# Patient Record
Sex: Male | Born: 1945
Health system: Southern US, Community
[De-identification: ages and names within clinical notes are randomized; demographics above are authoritative.]

## PROBLEM LIST (undated history)

## (undated) DIAGNOSIS — I491 Atrial premature depolarization: Secondary | ICD-10-CM

## (undated) DIAGNOSIS — G709 Myoneural disorder, unspecified: Secondary | ICD-10-CM

## (undated) DIAGNOSIS — E119 Type 2 diabetes mellitus without complications: Secondary | ICD-10-CM

## (undated) DIAGNOSIS — E785 Hyperlipidemia, unspecified: Secondary | ICD-10-CM

## (undated) DIAGNOSIS — M1990 Unspecified osteoarthritis, unspecified site: Secondary | ICD-10-CM

## (undated) DIAGNOSIS — I359 Nonrheumatic aortic valve disorder, unspecified: Secondary | ICD-10-CM

## (undated) DIAGNOSIS — N529 Male erectile dysfunction, unspecified: Secondary | ICD-10-CM

## (undated) DIAGNOSIS — K219 Gastro-esophageal reflux disease without esophagitis: Secondary | ICD-10-CM

## (undated) DIAGNOSIS — Q631 Lobulated, fused and horseshoe kidney: Secondary | ICD-10-CM

## (undated) DIAGNOSIS — I350 Nonrheumatic aortic (valve) stenosis: Secondary | ICD-10-CM

## (undated) DIAGNOSIS — L309 Dermatitis, unspecified: Secondary | ICD-10-CM

## (undated) DIAGNOSIS — I499 Cardiac arrhythmia, unspecified: Secondary | ICD-10-CM

## (undated) HISTORY — DX: Lobulated, fused and horseshoe kidney: Q63.1

## (undated) HISTORY — DX: Gastro-esophageal reflux disease without esophagitis: K21.9

## (undated) HISTORY — DX: Dermatitis, unspecified: L30.9

## (undated) HISTORY — DX: Unspecified osteoarthritis, unspecified site: M19.90

## (undated) HISTORY — DX: Nonrheumatic aortic valve disorder, unspecified: I35.9

## (undated) HISTORY — DX: Type 2 diabetes mellitus without complications: E11.9

## (undated) HISTORY — DX: Hyperlipidemia, unspecified: E78.5

## (undated) HISTORY — DX: Male erectile dysfunction, unspecified: N52.9

---

## 1998-01-08 ENCOUNTER — Ambulatory Visit (HOSPITAL_COMMUNITY): Admission: RE | Admit: 1998-01-08 | Discharge: 1998-01-08 | Payer: Self-pay | Admitting: Gastroenterology

## 2000-02-27 ENCOUNTER — Encounter: Admission: RE | Admit: 2000-02-27 | Discharge: 2000-02-27 | Payer: Self-pay | Admitting: Gastroenterology

## 2000-02-27 ENCOUNTER — Encounter: Payer: Self-pay | Admitting: Gastroenterology

## 2001-06-28 ENCOUNTER — Ambulatory Visit (HOSPITAL_COMMUNITY): Admission: RE | Admit: 2001-06-28 | Discharge: 2001-06-28 | Payer: Self-pay | Admitting: *Deleted

## 2001-06-28 ENCOUNTER — Encounter (INDEPENDENT_AMBULATORY_CARE_PROVIDER_SITE_OTHER): Payer: Self-pay | Admitting: Specialist

## 2002-03-13 ENCOUNTER — Encounter: Payer: Self-pay | Admitting: Family Medicine

## 2002-03-13 ENCOUNTER — Encounter: Admission: RE | Admit: 2002-03-13 | Discharge: 2002-03-13 | Payer: Self-pay | Admitting: Family Medicine

## 2007-06-21 ENCOUNTER — Encounter: Admission: RE | Admit: 2007-06-21 | Discharge: 2007-06-21 | Payer: Self-pay | Admitting: Family Medicine

## 2010-04-08 ENCOUNTER — Other Ambulatory Visit: Payer: Self-pay | Admitting: Family Medicine

## 2010-04-08 DIAGNOSIS — N5089 Other specified disorders of the male genital organs: Secondary | ICD-10-CM

## 2010-04-14 ENCOUNTER — Ambulatory Visit
Admission: RE | Admit: 2010-04-14 | Discharge: 2010-04-14 | Disposition: A | Discharge status: Home / Self Care | Payer: BC Managed Care – HMO | Source: Ambulatory Visit | Attending: Family Medicine | Admitting: Family Medicine

## 2010-04-14 DIAGNOSIS — N5089 Other specified disorders of the male genital organs: Secondary | ICD-10-CM

## 2011-02-23 DIAGNOSIS — N529 Male erectile dysfunction, unspecified: Secondary | ICD-10-CM | POA: Insufficient documentation

## 2013-02-10 ENCOUNTER — Other Ambulatory Visit: Payer: Self-pay | Admitting: Sports Medicine

## 2013-02-10 DIAGNOSIS — M545 Low back pain, unspecified: Secondary | ICD-10-CM

## 2013-02-19 ENCOUNTER — Other Ambulatory Visit: Payer: BC Managed Care – HMO

## 2013-03-01 ENCOUNTER — Other Ambulatory Visit: Payer: BC Managed Care – HMO

## 2013-03-31 ENCOUNTER — Ambulatory Visit
Admission: RE | Admit: 2013-03-31 | Discharge: 2013-03-31 | Disposition: A | Discharge status: Home / Self Care | Payer: 59 | Source: Ambulatory Visit | Attending: Sports Medicine | Admitting: Sports Medicine

## 2013-03-31 DIAGNOSIS — M545 Low back pain, unspecified: Secondary | ICD-10-CM

## 2013-10-23 ENCOUNTER — Institutional Professional Consult (permissible substitution): Payer: 59 | Admitting: Interventional Cardiology

## 2013-11-13 ENCOUNTER — Encounter: Payer: Self-pay | Admitting: Interventional Cardiology

## 2013-11-13 ENCOUNTER — Ambulatory Visit (INDEPENDENT_AMBULATORY_CARE_PROVIDER_SITE_OTHER): Payer: Medicare Other | Admitting: Interventional Cardiology

## 2013-11-13 VITALS — BP 148/84 | HR 58 | Ht 63.0 in | Wt 219.4 lb

## 2013-11-13 DIAGNOSIS — I491 Atrial premature depolarization: Secondary | ICD-10-CM | POA: Insufficient documentation

## 2013-11-13 DIAGNOSIS — E785 Hyperlipidemia, unspecified: Secondary | ICD-10-CM | POA: Insufficient documentation

## 2013-11-13 DIAGNOSIS — R011 Cardiac murmur, unspecified: Secondary | ICD-10-CM

## 2013-11-13 DIAGNOSIS — E663 Overweight: Secondary | ICD-10-CM

## 2013-11-13 DIAGNOSIS — R001 Bradycardia, unspecified: Secondary | ICD-10-CM

## 2013-11-13 NOTE — Patient Instructions (Signed)
Your physician recommends that you schedule a follow-up appointment as needed   Your physician has requested that you have an echocardiogram. Echocardiography is a painless test that uses sound waves to create images of your heart. It provides your doctor with information about the size and shape of your heart and how well your heart's chambers and valves are working. This procedure takes approximately one hour. There are no restrictions for this procedure.

## 2013-11-13 NOTE — Progress Notes (Signed)
Patient ID: QUANTEL MCINTURFF, male   DOB: 1945/09/25, 68 y.o.   MRN: [id number]     Patient ID: Nicholai Willette Atayde MRN: [id number] DOB/AGE: [birth year] 67 y.o.   Referring Physician Dr. Dorthy Cooler   Reason for Consultation PACs  HPI: 68 y/o who has had an irregular heart beat for many years.  He denies chest pain or lightheadedness.  No syncope.  He had a stress tests about 20 years ago which was apparently normal.  He had a spine problem which required injections.  He limits caffeine due to GERD.  He occasionally drinks tea.  He does not feel the skipped beat, but it has been mentioned by several doctors over the course of many years.  He is here for further evaluation.    His job is physical.  He has to lift heavy things.  No cardiac issues. No chest pain or discomfort.  He has some fatigue.  He has never been told that he stops breathing with sleeping   Current Outpatient Prescriptions  Medication Sig Dispense Refill  . acetaminophen (TYLENOL) 650 MG CR tablet Take 650 mg by mouth every 8 (eight) hours as needed for pain.    Marland Kitchen aspirin EC 81 MG tablet Take 81 mg by mouth daily.    . cholecalciferol (VITAMIN D) 1000 UNITS tablet Take 1,000 Units by mouth daily.    Marland Kitchen CIALIS 20 MG tablet   5  . CRESTOR 10 MG tablet   11  . meloxicam (MOBIC) 7.5 MG tablet   3  . Multiple Vitamin (MULTI VITAMIN DAILY) TABS Take by mouth.     No current facility-administered medications for this visit.   Past Medical History  Diagnosis Date  . Hyperlipidemia   . GERD (gastroesophageal reflux disease)   . Eczema   . ED (erectile dysfunction)   . Aortic valve calcification     mild  . Arthritis     knee  . Horseshoe kidney     Family History  Problem Relation Age of Onset  . Aneurysm Mother   . Stroke Father     History   Social History  . Marital Status: Single    Spouse Name: N/A    Number of Children: N/A  . Years of Education: N/A   Occupational History  . Not on file.   Social History  Main Topics  . Smoking status: Never Smoker   . Smokeless tobacco: Not on file  . Alcohol Use: Not on file  . Drug Use: Not on file  . Sexual Activity: Not on file   Other Topics Concern  . Not on file   Social History Narrative    No past surgical history on file.    (Not in a hospital admission)  Review of systems complete and found to be negative unless listed above .  No nausea, vomiting.  No fever chills, No focal weakness,  No palpitations.  Physical Exam: Filed Vitals:   11/13/13 1510  BP: 148/84  Pulse: 58    Weight: 219 lb 6.4 oz (99.519 kg)  Physical exam: No apparent distress, obese  [employer]/AT EOMI No JVD, No carotid bruit RRR S5K5 , 2/6 systolic murmur No wheezing Soft. NT, nondistended No edema. No focal motor or sensory deficits Normal affect  Labs:  No results found for: WBC, HGB, HCT, MCV, PLT No results for input(s): NA, K, CL, CO2, BUN, CREATININE, CALCIUM, PROT, BILITOT, ALKPHOS, ALT, AST, GLUCOSE in the last 168 hours.  Invalid input(s): LABALBU  No results found for: CKTOTAL, CKMB, CKMBINDEX, TROPONINI No results found for: CHOL No results found for: HDL No results found for: LDLCALC No results found for: TRIG No results found for: CHOLHDL No results found for: LDLDIRECT     EKG:NSR, PACs  ASSESSMENT AND PLAN:  1) PACs: Already minimizes caffeine.  This is likely benign.  We'll check echocardiogram for any structural heart disease.  2) Murmur: h/o artic valve clacification.  Plan echocardiogram to eval for aortic stenosis.    3) Elevated BP: Better on repeat check.  Discussed that he is at risk for developing hypertension. He would benefit from weight loss.  4) Obesity: Stressed importance of weight loss.  Set target of 200 lbs to get to in the next months.  5) Hyperlipidemia: Continue crestor.     Signed:   Mina Marble, MD, [facility] 11/13/2013, 3:27 PM

## 2013-11-14 ENCOUNTER — Ambulatory Visit (HOSPITAL_COMMUNITY): Payer: Medicare Other | Attending: Cardiology

## 2013-11-14 ENCOUNTER — Other Ambulatory Visit: Payer: Self-pay

## 2013-11-14 DIAGNOSIS — R011 Cardiac murmur, unspecified: Secondary | ICD-10-CM

## 2013-11-21 ENCOUNTER — Telehealth: Payer: Self-pay | Admitting: Interventional Cardiology

## 2013-11-21 NOTE — Telephone Encounter (Signed)
Pt rtn call re lab results, pls call [phone], if na, leave results on voicemail

## 2013-11-21 NOTE — Telephone Encounter (Signed)
I left a message of the patient's results at the contact # below.

## 2013-11-23 ENCOUNTER — Encounter: Payer: Self-pay | Admitting: Interventional Cardiology

## 2013-11-24 ENCOUNTER — Telehealth: Payer: Self-pay | Admitting: Interventional Cardiology

## 2013-11-24 NOTE — Telephone Encounter (Signed)
New problem ° ° °Pt returning call concerning results. Please call pt. °

## 2013-11-24 NOTE — Telephone Encounter (Signed)
lmtcb

## 2013-11-28 NOTE — Telephone Encounter (Signed)
[email or web address]

## 2013-11-29 NOTE — Telephone Encounter (Signed)
Letter sent to patient to call for test results.

## 2013-11-29 NOTE — Telephone Encounter (Signed)
Left message for patient to call back  

## 2013-12-04 ENCOUNTER — Telehealth: Payer: Self-pay | Admitting: Interventional Cardiology

## 2013-12-04 NOTE — Telephone Encounter (Signed)
New message    Calling for test results  

## 2013-12-06 NOTE — Telephone Encounter (Signed)
I spoke with the patient and made him aware of his echo results.

## 2013-12-06 NOTE — Telephone Encounter (Signed)
Left message to call back  

## 2014-01-24 ENCOUNTER — Other Ambulatory Visit: Payer: Self-pay | Admitting: Gastroenterology

## 2014-02-09 ENCOUNTER — Other Ambulatory Visit: Payer: Self-pay | Admitting: Neurological Surgery

## 2014-02-09 DIAGNOSIS — M48061 Spinal stenosis, lumbar region without neurogenic claudication: Secondary | ICD-10-CM

## 2014-02-22 ENCOUNTER — Other Ambulatory Visit

## 2014-03-02 ENCOUNTER — Ambulatory Visit
Admission: RE | Admit: 2014-03-02 | Discharge: 2014-03-02 | Disposition: A | Discharge status: Home / Self Care | Payer: 59 | Source: Ambulatory Visit | Attending: Neurological Surgery | Admitting: Neurological Surgery

## 2014-03-02 DIAGNOSIS — M48061 Spinal stenosis, lumbar region without neurogenic claudication: Secondary | ICD-10-CM

## 2014-05-22 ENCOUNTER — Other Ambulatory Visit: Payer: Self-pay | Admitting: Gastroenterology

## 2014-05-23 ENCOUNTER — Encounter (HOSPITAL_COMMUNITY): Payer: Self-pay | Admitting: *Deleted

## 2014-05-26 NOTE — Anesthesia Preprocedure Evaluation (Addendum)
Anesthesia Evaluation  Patient identified by MRN, date of birth, ID band Patient awake    Reviewed: Allergy & Precautions, NPO status , Patient's Chart, lab work & pertinent test results, reviewed documented beta blocker date and time   Airway Mallampati: II       Dental  (+) Dental Advisory Given, Teeth Intact   Pulmonary  breath sounds clear to auscultation        Cardiovascular + Valvular Problems/Murmurs AS Rhythm:Regular  ECHO 2015 EF 60%, AS area 1.3, gradient 18   Neuro/Psych    GI/Hepatic GERD-  ,  Endo/Other    Renal/GU Horse shoe kidney     Musculoskeletal   Abdominal (+)  Abdomen: soft.    Peds  Hematology   Anesthesia Other Findings   Reproductive/Obstetrics                            Anesthesia Physical Anesthesia Plan  ASA: II  Anesthesia Plan: MAC   Post-op Pain Management:    Induction: Intravenous  Airway Management Planned: Nasal Cannula  Additional Equipment:   Intra-op Plan:   Post-operative Plan:   Informed Consent: I have reviewed the patients History and Physical, chart, labs and discussed the procedure including the risks, benefits and alternatives for the proposed anesthesia with the patient or authorized representative who has indicated his/her understanding and acceptance.     Plan Discussed with:   Anesthesia Plan Comments: (AS, moderate)        Anesthesia Quick Evaluation

## 2014-05-29 ENCOUNTER — Encounter (HOSPITAL_COMMUNITY)
Admission: RE | Disposition: A | Discharge status: Home / Self Care | Payer: Self-pay | Source: Ambulatory Visit | Attending: Gastroenterology

## 2014-05-29 ENCOUNTER — Ambulatory Visit (HOSPITAL_COMMUNITY): Payer: 59 | Admitting: Anesthesiology

## 2014-05-29 ENCOUNTER — Ambulatory Visit (HOSPITAL_COMMUNITY)
Admission: RE | Admit: 2014-05-29 | Discharge: 2014-05-29 | Disposition: A | Discharge status: Home / Self Care | Payer: 59 | Source: Ambulatory Visit | Attending: Gastroenterology | Admitting: Gastroenterology

## 2014-05-29 ENCOUNTER — Encounter (HOSPITAL_COMMUNITY): Payer: Self-pay | Admitting: Certified Registered"

## 2014-05-29 DIAGNOSIS — M1990 Unspecified osteoarthritis, unspecified site: Secondary | ICD-10-CM | POA: Insufficient documentation

## 2014-05-29 DIAGNOSIS — Z86010 Personal history of colonic polyps: Secondary | ICD-10-CM | POA: Diagnosis present

## 2014-05-29 DIAGNOSIS — N529 Male erectile dysfunction, unspecified: Secondary | ICD-10-CM | POA: Insufficient documentation

## 2014-05-29 DIAGNOSIS — Z79899 Other long term (current) drug therapy: Secondary | ICD-10-CM | POA: Insufficient documentation

## 2014-05-29 DIAGNOSIS — I350 Nonrheumatic aortic (valve) stenosis: Secondary | ICD-10-CM | POA: Diagnosis not present

## 2014-05-29 DIAGNOSIS — Z888 Allergy status to other drugs, medicaments and biological substances status: Secondary | ICD-10-CM | POA: Insufficient documentation

## 2014-05-29 DIAGNOSIS — E663 Overweight: Secondary | ICD-10-CM | POA: Insufficient documentation

## 2014-05-29 DIAGNOSIS — Z7982 Long term (current) use of aspirin: Secondary | ICD-10-CM | POA: Insufficient documentation

## 2014-05-29 DIAGNOSIS — K6389 Other specified diseases of intestine: Secondary | ICD-10-CM | POA: Diagnosis not present

## 2014-05-29 DIAGNOSIS — D120 Benign neoplasm of cecum: Secondary | ICD-10-CM | POA: Insufficient documentation

## 2014-05-29 DIAGNOSIS — E785 Hyperlipidemia, unspecified: Secondary | ICD-10-CM | POA: Diagnosis not present

## 2014-05-29 DIAGNOSIS — K219 Gastro-esophageal reflux disease without esophagitis: Secondary | ICD-10-CM | POA: Insufficient documentation

## 2014-05-29 HISTORY — DX: Myoneural disorder, unspecified: G70.9

## 2014-05-29 HISTORY — PX: COLONOSCOPY WITH PROPOFOL: SHX5780

## 2014-05-29 SURGERY — COLONOSCOPY WITH PROPOFOL
Anesthesia: Monitor Anesthesia Care

## 2014-05-29 MED ORDER — PROPOFOL 10 MG/ML IV BOLUS
INTRAVENOUS | Status: AC
  Filled 2014-05-29: qty 20

## 2014-05-29 MED ORDER — LACTATED RINGERS IV SOLN
INTRAVENOUS | Status: DC | PRN
  Administered 2014-05-29: 08:00:00 via INTRAVENOUS

## 2014-05-29 MED ORDER — PROPOFOL 10 MG/ML IV BOLUS
INTRAVENOUS | Status: DC | PRN
  Administered 2014-05-29: 200 mg via INTRAVENOUS
  Administered 2014-05-29: 50 mg via INTRAVENOUS
  Administered 2014-05-29: 100 mg via INTRAVENOUS

## 2014-05-29 MED ORDER — SODIUM CHLORIDE 0.9 % IV SOLN: INTRAVENOUS | Status: DC

## 2014-05-29 MED ORDER — LIDOCAINE HCL (PF) 2 % IJ SOLN
INTRAMUSCULAR | Status: DC | PRN
  Administered 2014-05-29: 20 mg via INTRADERMAL

## 2014-05-29 MED ORDER — LIDOCAINE HCL (CARDIAC) 20 MG/ML IV SOLN
INTRAVENOUS | Status: AC
  Filled 2014-05-29: qty 5

## 2014-05-29 MED ORDER — PROPOFOL INFUSION 10 MG/ML OPTIME: INTRAVENOUS | Status: DC | PRN

## 2014-05-29 SURGICAL SUPPLY — 21 items

## 2014-05-29 NOTE — Transfer of Care (Signed)
Immediate Anesthesia Transfer of Care Note  Patient: Allen Sanchez  Procedure(s) Performed: Procedure(s): COLONOSCOPY WITH PROPOFOL (N/A)  Patient Location: PACU  Anesthesia Type:MAC  Level of Consciousness:  sedated, patient cooperative and responds to stimulation  Airway & Oxygen Therapy:Patient Spontanous Breathing and Patient connected to face mask oxgen  Post-op Assessment:  Report given to PACU RN and Post -op Vital signs reviewed and stable  Post vital signs:  Reviewed and stable  Last Vitals:  Filed Vitals:   05/29/14 0730  BP: 148/91  Pulse: 58  Temp: 36.6 C  Resp: 17    Complications: No apparent anesthesia complications

## 2014-05-29 NOTE — Anesthesia Postprocedure Evaluation (Signed)
  Anesthesia Post-op Note  Patient: Allen Sanchez  Procedure(s) Performed: Procedure(s): COLONOSCOPY WITH PROPOFOL (N/A)  Patient Location: PACU  Anesthesia Type:MAC  Level of Consciousness: awake  Airway and Oxygen Therapy: Patient Spontanous Breathing  Post-op Pain: none  Post-op Assessment: Post-op Vital signs reviewed and Patient's Cardiovascular Status Stable  Post-op Vital Signs: Reviewed and stable  Last Vitals:  Filed Vitals:   05/29/14 0730  BP: 148/91  Pulse: 58  Temp: 36.6 C  Resp: 17    Complications: No apparent anesthesia complications

## 2014-05-29 NOTE — Discharge Instructions (Signed)

## 2014-05-29 NOTE — H&P (Signed)
  Procedure: Surveillance colonoscopy. Normal surveillance colonoscopy performed on 10/21/2007. History of adenomatous colon polyps removed colonoscopically in the past  History: The patient is a 69 year old male born 08-06-1945. He is scheduled to undergo a surveillance colonoscopy today.  Past medical history: Hypercholesterolemia. Gastroesophageal reflux. Eczema. Calcification of the aortic valve and mitral valve. Osteoarthritis of the knee. Spinal stenosis. Vasectomy.  Medication allergies: None. Medication side effects occurred with the following drugs: Statin drugs cause myalgias.  Exam: The patient is alert and lying comfortably on the endoscopy stretcher. Abdomen is soft and nontender to palpation. Lungs are clear to auscultation. Cardiac exam reveals a regular rhythm.  Plan: Proceed with surveillance colonoscopy

## 2014-05-29 NOTE — Op Note (Signed)
Procedure: Surveillance colonoscopy. Adenomatous colon polyps removed colonoscopically in the past  Endoscopist: Earle Gell  Premedication: Propofol administered by anesthesia  Procedure: The patient was placed in the left lateral decubitus position. Anal inspection and digital rectal exam were normal. The Pentax pediatric colonoscope was introduced into the rectum and advanced to the cecum. A normal-appearing appendiceal orifice was identified. A prominent ileocecal valve was intubated and the terminal ileum inspected. Colonic preparation for the exam today was good. Withdrawal time was 14 minutes  Rectum. Normal. Retroflexed view of the distal rectum was normal  Sigmoid colon and descending colon. Normal  Splenic flexure. Normal  Transverse colon. Normal  Hepatic flexure. Normal  Ascending colon. Normal  Cecum and ileocecal valve. From the proximal cecum adjacent to the appendiceal orifice, a 7 mm sessile polyp was removed with the hot snare. The polypectomy site was closed with an Endo Clip to prevent bleeding. The surface of the prominent ileocecal valve was biopsied to rule out neoplastic tissue. The prominent ileocecal valve has been biopsied on previous colonoscopic exams and diagnosed as a lipomatous ileocecal valve.  Assessment: A 7 mm proximal cecal polyp was removed with the hot snare and the polypectomy site closed with an Endo Clip. The prominent appearing ileocecal valve was biopsied to rule out neoplastic tissue.  Recommendation: I will review the pathology report to determine when the patient should undergo a repeat colonoscopy

## 2014-05-30 ENCOUNTER — Encounter (HOSPITAL_COMMUNITY): Payer: Self-pay | Admitting: Gastroenterology

## 2016-09-16 ENCOUNTER — Other Ambulatory Visit: Payer: Self-pay | Admitting: Physical Medicine and Rehabilitation

## 2016-09-16 DIAGNOSIS — M545 Low back pain: Secondary | ICD-10-CM

## 2016-09-16 DIAGNOSIS — M7918 Myalgia, other site: Secondary | ICD-10-CM

## 2016-09-28 ENCOUNTER — Ambulatory Visit
Admission: RE | Admit: 2016-09-28 | Discharge: 2016-09-28 | Disposition: A | Discharge status: Home / Self Care | Payer: BLUE CROSS/BLUE SHIELD | Source: Ambulatory Visit | Attending: Physical Medicine and Rehabilitation | Admitting: Physical Medicine and Rehabilitation

## 2016-09-28 DIAGNOSIS — M545 Low back pain: Secondary | ICD-10-CM

## 2016-09-28 DIAGNOSIS — M7918 Myalgia, other site: Secondary | ICD-10-CM

## 2017-01-14 ENCOUNTER — Ambulatory Visit: Payer: BLUE CROSS/BLUE SHIELD

## 2017-02-04 ENCOUNTER — Encounter: Payer: Self-pay | Admitting: Dietician

## 2017-02-04 ENCOUNTER — Encounter: Payer: BLUE CROSS/BLUE SHIELD | Attending: Family Medicine | Admitting: Dietician

## 2017-02-04 DIAGNOSIS — Z713 Dietary counseling and surveillance: Secondary | ICD-10-CM | POA: Diagnosis present

## 2017-02-04 DIAGNOSIS — E119 Type 2 diabetes mellitus without complications: Secondary | ICD-10-CM | POA: Diagnosis not present

## 2017-02-04 NOTE — Progress Notes (Signed)
Patient was seen on 02/04/17 for the first of a series of three diabetes self-management courses at [facility].  Patient Education Plan per assessed needs and concerns is to attend three course education program for Diabetes Self Management Education.  The following learning objectives were met by the patient during this class:  Describe diabetes  State some common risk factors for diabetes  Defines the role of glucose and insulin  Identifies type of diabetes and pathophysiology  Describe the relationship between diabetes and cardiovascular risk  State the members of the Healthcare Team  States the rationale for glucose monitoring  State when to test glucose  State their individual Target Range  State the importance of logging glucose readings  Describe how to interpret glucose readings  Identifies A1C target  Explain the correlation between A1c and eAG values  State symptoms and treatment of high blood glucose  State symptoms and treatment of low blood glucose  Explain proper technique for glucose testing  Identifies proper sharps disposal  Handouts given during class include:  Living Well with Diabetes book  Carb Counting and Meal Planning book  Meal Plan Card  Meal planning worksheet  Low Sodium Flavoring Tips  Types of Fats  The diabetes portion plate  K8M to eAG Conversion Chart  Diabetes Recommended Care Schedule  Support Group  Diabetes Success Plan  Core Class Satisfaction Survey   Follow-Up Plan:  Attend core 2

## 2017-02-06 ENCOUNTER — Emergency Department (HOSPITAL_COMMUNITY): Payer: BLUE CROSS/BLUE SHIELD

## 2017-02-06 ENCOUNTER — Emergency Department (HOSPITAL_COMMUNITY)
Admission: EM | Admit: 2017-02-06 | Discharge: 2017-02-06 | Disposition: A | Discharge status: Home / Self Care | Payer: BLUE CROSS/BLUE SHIELD | Attending: Emergency Medicine | Admitting: Emergency Medicine

## 2017-02-06 DIAGNOSIS — R42 Dizziness and giddiness: Secondary | ICD-10-CM | POA: Diagnosis present

## 2017-02-06 DIAGNOSIS — E119 Type 2 diabetes mellitus without complications: Secondary | ICD-10-CM | POA: Diagnosis not present

## 2017-02-06 DIAGNOSIS — I499 Cardiac arrhythmia, unspecified: Secondary | ICD-10-CM | POA: Diagnosis not present

## 2017-02-06 DIAGNOSIS — R001 Bradycardia, unspecified: Secondary | ICD-10-CM | POA: Diagnosis not present

## 2017-02-06 LAB — COMPREHENSIVE METABOLIC PANEL
ALT: 21 U/L (ref 17–63)
AST: 24 U/L (ref 15–41)
Albumin: 3.8 g/dL (ref 3.5–5.0)
Alkaline Phosphatase: 59 U/L (ref 38–126)
Anion gap: 9 (ref 5–15)
BILIRUBIN TOTAL: 0.7 mg/dL (ref 0.3–1.2)
BUN: 14 mg/dL (ref 6–20)
CALCIUM: 9.5 mg/dL (ref 8.9–10.3)
CO2: 24 mmol/L (ref 22–32)
Chloride: 107 mmol/L (ref 101–111)
Creatinine, Ser: 0.95 mg/dL (ref 0.61–1.24)
GFR calc Af Amer: >60 mL/min (ref >60)
GFR calc non Af Amer: >60 mL/min (ref >60)
Glucose, Bld: 116 mg/dL — ABNORMAL HIGH (ref 65–99)
POTASSIUM: 4.2 mmol/L (ref 3.5–5.1)
Sodium: 140 mmol/L (ref 135–145)
Total Protein: 6.9 g/dL (ref 6.5–8.1)

## 2017-02-06 LAB — CBC WITH DIFFERENTIAL/PLATELET
Basophils Absolute: 0 10*3/uL (ref 0.0–0.1)
Basophils Relative: 1 %
Eosinophils Absolute: 0.1 10*3/uL (ref 0.0–0.7)
Eosinophils Relative: 2 %
HEMATOCRIT: 44.5 % (ref 39.0–52.0)
Hemoglobin: 14.7 g/dL (ref 13.0–17.0)
Lymphocytes Relative: 43 %
Lymphs Abs: 1.2 10*3/uL (ref 0.7–4.0)
MCH: 29.1 pg (ref 26.0–34.0)
MCHC: 33 g/dL (ref 30.0–36.0)
MCV: 88.1 fL (ref 78.0–100.0)
MONO ABS: 0.2 10*3/uL (ref 0.1–1.0)
MONOS PCT: 9 %
Neutro Abs: 1.3 10*3/uL — ABNORMAL LOW (ref 1.7–7.7)
Neutrophils Relative %: 45 %
Platelets: 120 10*3/uL — ABNORMAL LOW (ref 150–400)
RBC: 5.05 MIL/uL (ref 4.22–5.81)
RDW: 14.6 % (ref 11.5–15.5)
WBC: 2.7 10*3/uL — ABNORMAL LOW (ref 4.0–10.5)

## 2017-02-06 LAB — I-STAT TROPONIN, ED: TROPONIN I, POC: 0.01 ng/mL (ref 0.00–0.08)

## 2017-02-06 NOTE — ED Provider Notes (Signed)
[facility] EMERGENCY DEPARTMENT Provider Note   CSN: [id number] Arrival date & time: 02/06/17  3016     History   Chief Complaint Chief Complaint  Patient presents with  . Bradycardia    HPI   Blood pressure (!) 168/83, pulse (!) 58, temperature 98 F (36.7 C), temperature source Oral, resp. rate 16, SpO2 99 %.  Allen Sanchez is a 72 y.o. male complaining of intermittent lightheaded sensation when going from sitting to standing over the course of the last several weeks, it has not stopped him from working, he has not syncopized.  The episodes are not getting worse but he states that he came in today to double check that he is okay to celebrate Super Bowl Sunday tomorrow.  He states that he has been taking apple cider vinegar with honey and water 2 weeks ago about the same onset of symptoms timing.  He stopped taking this several days ago.  He denies vertigo, nausea, vomiting, fever, chills, chest pain, shortness of breath, abdominal pain, melena, hematochezia.  He is not anticoagulated, he takes a low-dose aspirin daily.  He has been recently diagnosed with type 2 diabetes but is not taking any medication for that, his primary care doctor at [facility] asked him to go to a nutrition program first.  He takes a statin regularly.  He denies polyuria and polydipsia.  Past Medical History:  Diagnosis Date  . Aortic valve calcification    mild  . Arthritis    knee  . Diabetes mellitus without complication ([place])   . Eczema   . ED (erectile dysfunction)   . GERD (gastroesophageal reflux disease)   . Horseshoe kidney   . Hyperlipidemia   . Neuromuscular disorder ([facility])    Sciatic nerve pain greater left -under physical therapy now    Patient Active Problem List   Diagnosis Date Noted  . Murmur, cardiac 11/13/2013  . Overweight 11/13/2013  . PAC (premature atrial contraction) 11/13/2013  . Hyperlipidemia 11/13/2013    Past Surgical History:  Procedure  Laterality Date  . COLONOSCOPY WITH PROPOFOL N/A 05/29/2014   Procedure: COLONOSCOPY WITH PROPOFOL;  Surgeon: Garlan Fair, MD;  Location: [facility] ENDOSCOPY;  Service: Endoscopy;  Laterality: N/A;       Home Medications    Prior to Admission medications   Medication Sig Start Date End Date Taking? Authorizing Provider  acetaminophen (TYLENOL) 650 MG CR tablet Take 650 mg by mouth every 8 (eight) hours as needed for pain.    [provider]  cholecalciferol (VITAMIN D) 1000 UNITS tablet Take 1,000 Units by mouth daily.    [provider]  CIALIS 20 MG tablet  08/25/13   [provider]  CRESTOR 10 MG tablet  11/05/13   [provider]  meloxicam (MOBIC) 7.5 MG tablet  10/29/13   [provider]  Multiple Vitamin (MULTI VITAMIN DAILY) TABS Take by mouth.    [provider]    Family History Family History  Problem Relation Age of Onset  . Aneurysm Mother   . Stroke Father     Social History Social History   Tobacco Use  . Smoking status: Never Smoker  Substance Use Topics  . Alcohol use: Yes    Alcohol/week: 0.0 oz    Comment: 2 drinks per week(Sundays)  . Drug use: Not on file     Allergies   Crestor [rosuvastatin]; Lipitor [atorvastatin]; Pravastatin; and Zetia [ezetimibe]   Review of Systems Review of  Systems  A complete review of systems was obtained and all systems are negative except as noted in the HPI and PMH.   Physical Exam Updated Vital Signs BP (!) 150/76   Pulse 89   Temp 98 F (36.7 C) (Oral)   Resp 16   SpO2 100%   Physical Exam  Constitutional: He is oriented to person, place, and time. He appears well-developed and well-nourished. No distress.  HENT:  Head: Normocephalic and atraumatic.  Mouth/Throat: Oropharynx is clear and moist.  Eyes: Conjunctivae and EOM are normal. Pupils are equal, round, and reactive to light.  Neck: Normal range of motion. No JVD present. No tracheal deviation  present.  Cardiovascular: Normal rate, regular rhythm and intact distal pulses.  Radial pulse equal bilaterally  Pulmonary/Chest: Effort normal and breath sounds normal. No stridor. No respiratory distress. He has no wheezes. He has no rales. He exhibits no tenderness.  Abdominal: Soft. He exhibits no distension and no mass. There is no tenderness. There is no rebound and no guarding.  Musculoskeletal: Normal range of motion. He exhibits no edema or tenderness.  No calf asymmetry, superficial collaterals, palpable cords, edema, Homans sign negative bilaterally.    Neurological: He is alert and oriented to person, place, and time.  Skin: Skin is warm. He is not diaphoretic.  Psychiatric: He has a normal mood and affect.  Nursing note and vitals reviewed.    ED Treatments / Results  Labs (all labs ordered are listed, but only abnormal results are displayed) Labs Reviewed  CBC WITH DIFFERENTIAL/PLATELET - Abnormal; Notable for the following components:      Result Value   WBC 2.7 (*)    Platelets 120 (*)    Neutro Abs 1.3 (*)    All other components within normal limits  COMPREHENSIVE METABOLIC PANEL - Abnormal; Notable for the following components:   Glucose, Bld 116 (*)    All other components within normal limits  I-STAT TROPONIN, ED    EKG  EKG Interpretation  Date/Time:  Saturday February 06 2017 09:53:39 EST Ventricular Rate:  68 PR Interval:    QRS Duration: 82 QT Interval:  410 QTC Calculation: 436 R Axis:   38 Text Interpretation:  Sinus rhythm Atrial premature complex Baseline wander in lead(s) II III aVF Confirmed by Lacretia Leigh ([id number]) on 02/06/2017 11:07:00 AM       Radiology Dg Chest 2 View  Result Date: 02/06/2017 CLINICAL DATA:  Dizziness for 1 week.  Heart murmur. EXAM: CHEST  2 VIEW COMPARISON:  None. FINDINGS: Enlarged cardiac silhouette. Mild calcific atherosclerotic disease of the aorta. There is no evidence of focal airspace consolidation, pleural  effusion or pneumothorax. Osseous structures are without acute abnormality. Soft tissues are grossly normal. IMPRESSION: Enlarged cardiac silhouette. No evidence of pulmonary edema or focal airspace consolidation. Electronically Signed   By: Fidela Salisbury M.D.   On: 02/06/2017 11:28    Procedures Procedures (including critical care time)  Medications Ordered in ED Medications - No data to display   Initial Impression / Assessment and Plan / ED Course  I have reviewed the triage vital signs and the nursing notes.  Pertinent labs & imaging results that were available during my care of the patient were reviewed by me and considered in my medical decision making (see chart for details).     Vitals:   02/06/17 1015 02/06/17 1100 02/06/17 1130 02/06/17 1230  BP: (!) 148/99 (!) 158/78 (!) 155/76 (!) 150/76  Pulse: 96 92 89  Resp: (!) 21 16    Temp:      TempSrc:      SpO2: 100% 100% 100% 100%    Rondell Pardon Hullum is 72 y.o. male presenting with course of the last few weeks.  EKG with sinus bradycardia and PACs.  This is consistent with his prior EKG.  No real chest pain.  Orthostatic vital signs reassuring.  Blood work with no significant abnormality.  This is a shared visit with the attending physician who personally evaluated the patient and agrees with the care plan.   Evaluation does not show pathology that would require ongoing emergent intervention or inpatient treatment. Pt is hemodynamically stable and mentating appropriately. Discussed findings and plan with patient/guardian, who agrees with care plan. All questions answered. Return precautions discussed and outpatient follow up given.      Final Clinical Impressions(s) / ED Diagnoses   Final diagnoses:  [place]    ED Discharge Orders    None       Waynetta Pean 02/06/17 1249

## 2017-02-06 NOTE — Discharge Instructions (Signed)
Please follow with your primary care doctor in the next 2 days for a check-up. They must obtain records for further management.  ° °Do not hesitate to return to the Emergency Department for any new, worsening or concerning symptoms.  ° °

## 2017-02-06 NOTE — ED Provider Notes (Signed)
Medical screening examination/treatment/procedure(s) were conducted as a shared visit with non-physician practitioner(s) and myself.  I personally evaluated the patient during the encounter.   EKG Interpretation  Date/Time:  Saturday February 06 2017 09:53:39 EST Ventricular Rate:  68 PR Interval:    QRS Duration: 82 QT Interval:  410 QTC Calculation: 436 R Axis:   38 Text Interpretation:  Sinus rhythm Atrial premature complex Baseline wander in lead(s) II III aVF Confirmed by Lacretia Leigh ([id number]) on 02/06/2017 11:07:21 AM      72 year old male presents with weakness that has been intermittent times 1 week.  Is worse with standing.  Denies any associated chest pain or chest pressure.  Has had some belching and burping.  Labs here are reassuring.  Stable for discharge with return precautions   Lacretia Leigh, MD 02/06/17 1222

## 2017-02-06 NOTE — ED Notes (Signed)
Pt did well ambulating pass bathroom and back to room not complaints of dizz and SOB

## 2017-02-06 NOTE — ED Triage Notes (Signed)
Pt arrives by [facility] from pcp where pt was seen for weakness that has been intermittent for the last week. Pt was noted to be in a sinus bradycardia with frequent PACs. Pt reports no current pain but feels a feeling of "ingestion".

## 2017-02-11 ENCOUNTER — Encounter: Payer: BLUE CROSS/BLUE SHIELD | Attending: Family Medicine | Admitting: Dietician

## 2017-02-11 DIAGNOSIS — E119 Type 2 diabetes mellitus without complications: Secondary | ICD-10-CM | POA: Insufficient documentation

## 2017-02-11 DIAGNOSIS — Z713 Dietary counseling and surveillance: Secondary | ICD-10-CM | POA: Diagnosis not present

## 2017-02-11 NOTE — Progress Notes (Signed)
Patient was seen on 02/11/17 for the second of a series of three diabetes self-management courses at the [facility]. The following learning objectives were met by the patient during this class:   Describe the role of different macronutrients on glucose  Explain how carbohydrates affect blood glucose  State what foods contain the most carbohydrates  Demonstrate carbohydrate counting  Demonstrate how to read Nutrition Facts food label  Describe effects of various fats on heart health  Describe the importance of good nutrition for health and healthy eating strategies  Describe techniques for managing your shopping, cooking and meal planning  List strategies to follow meal plan when dining out  Describe the effects of alcohol on glucose and how to use it safely  Goals:  Follow Diabetes Meal Plan as instructed  Aim to spread carbs evenly throughout the day  Aim for 3 meals per day and snacks as needed Include lean protein foods to meals/snacks  Monitor glucose levels as instructed by your doctor   Follow-Up Plan:  Attend Core 3  Work towards following your personal food plan.   

## 2017-02-18 ENCOUNTER — Encounter: Payer: BLUE CROSS/BLUE SHIELD | Admitting: Dietician

## 2017-02-18 DIAGNOSIS — E119 Type 2 diabetes mellitus without complications: Secondary | ICD-10-CM

## 2017-02-18 DIAGNOSIS — Z713 Dietary counseling and surveillance: Secondary | ICD-10-CM | POA: Diagnosis not present

## 2017-02-18 NOTE — Progress Notes (Signed)
Patient was seen on 02/18/17 for the third of a series of three diabetes self-management courses at the [facility].   Allen Sanchez the amount of activity recommended for healthy living . Describe activities suitable for individual needs . Identify ways to regularly incorporate activity into daily life . Identify barriers to activity and ways to over come these barriers  Identify diabetes medications being personally used and their primary action for lowering glucose and possible side effects . Describe role of stress on blood glucose and develop strategies to address psychosocial issues . Identify diabetes complications and ways to prevent them  Explain how to manage diabetes during illness . Evaluate success in meeting personal goal . Establish 2-3 goals that they will plan to diligently work on  Goals:   I will count my carb choices at most meals and snacks  I will be active 10 minutes or more 3 times a week  I will eat less unhealthy fats by reading Nutrition Labels  Your patient has identified these potential barriers to change:  None  Your patient has identified their diabetes self-care support plan as  Family Support    Plan:  Attend Support Group as desired

## 2017-02-26 DIAGNOSIS — M545 Low back pain: Secondary | ICD-10-CM | POA: Diagnosis not present

## 2017-02-26 DIAGNOSIS — M48061 Spinal stenosis, lumbar region without neurogenic claudication: Secondary | ICD-10-CM | POA: Diagnosis not present

## 2017-02-26 DIAGNOSIS — M47817 Spondylosis without myelopathy or radiculopathy, lumbosacral region: Secondary | ICD-10-CM | POA: Diagnosis not present

## 2017-03-02 ENCOUNTER — Telehealth: Payer: Self-pay

## 2017-03-02 NOTE — Telephone Encounter (Signed)
SENT REFERRAL TO [facility]

## 2017-03-03 ENCOUNTER — Telehealth (HOSPITAL_COMMUNITY): Payer: Self-pay | Admitting: Family Medicine

## 2017-03-04 ENCOUNTER — Other Ambulatory Visit: Payer: Self-pay | Admitting: Family Medicine

## 2017-03-04 DIAGNOSIS — I491 Atrial premature depolarization: Secondary | ICD-10-CM

## 2017-03-04 DIAGNOSIS — I700 Atherosclerosis of aorta: Secondary | ICD-10-CM

## 2017-03-04 DIAGNOSIS — R42 Dizziness and giddiness: Secondary | ICD-10-CM

## 2017-03-04 NOTE — Telephone Encounter (Signed)
User: Cherie Dark A Date/time: 03/03/17 11:23 AM  Comment: Called pt and lmsg for him to CB to sch echo.Vassie Moment  Context:  Outcome:   Phone number: [phone] Phone Type: Home Phone  Comm. type: Telephone Call type: Outgoing  Contact: Buys, Demarko L Relation to patient: Self

## 2017-03-11 ENCOUNTER — Other Ambulatory Visit: Payer: Self-pay

## 2017-03-11 ENCOUNTER — Ambulatory Visit (HOSPITAL_COMMUNITY): Payer: BLUE CROSS/BLUE SHIELD | Attending: Cardiology

## 2017-03-11 DIAGNOSIS — E119 Type 2 diabetes mellitus without complications: Secondary | ICD-10-CM | POA: Insufficient documentation

## 2017-03-11 DIAGNOSIS — R011 Cardiac murmur, unspecified: Secondary | ICD-10-CM | POA: Insufficient documentation

## 2017-03-11 DIAGNOSIS — I4891 Unspecified atrial fibrillation: Secondary | ICD-10-CM | POA: Insufficient documentation

## 2017-03-11 DIAGNOSIS — I491 Atrial premature depolarization: Secondary | ICD-10-CM | POA: Diagnosis not present

## 2017-03-11 DIAGNOSIS — I119 Hypertensive heart disease without heart failure: Secondary | ICD-10-CM | POA: Diagnosis not present

## 2017-03-11 DIAGNOSIS — I083 Combined rheumatic disorders of mitral, aortic and tricuspid valves: Secondary | ICD-10-CM | POA: Insufficient documentation

## 2017-03-11 DIAGNOSIS — E785 Hyperlipidemia, unspecified: Secondary | ICD-10-CM | POA: Diagnosis not present

## 2017-03-11 DIAGNOSIS — I700 Atherosclerosis of aorta: Secondary | ICD-10-CM | POA: Insufficient documentation

## 2017-03-11 DIAGNOSIS — R42 Dizziness and giddiness: Secondary | ICD-10-CM

## 2017-03-23 ENCOUNTER — Telehealth: Payer: Self-pay

## 2017-03-23 NOTE — Telephone Encounter (Signed)
Sent referral to scheduling, attached notes to march file.  

## 2017-04-16 ENCOUNTER — Ambulatory Visit (INDEPENDENT_AMBULATORY_CARE_PROVIDER_SITE_OTHER): Payer: BLUE CROSS/BLUE SHIELD | Admitting: Cardiovascular Disease

## 2017-04-16 ENCOUNTER — Encounter: Payer: Self-pay | Admitting: Cardiovascular Disease

## 2017-04-16 DIAGNOSIS — I350 Nonrheumatic aortic (valve) stenosis: Secondary | ICD-10-CM

## 2017-04-16 DIAGNOSIS — E7800 Pure hypercholesterolemia, unspecified: Secondary | ICD-10-CM

## 2017-04-16 NOTE — Patient Instructions (Signed)
Medication Instructions: Your physician recommends that you continue on your current medications as directed. Please refer to the Current Medication list given to you today.   Testing/Procedures: Your physician has requested that you have an echocardiogram in 1 year. Echocardiography is a painless test that uses sound waves to create images of your heart. It provides your doctor with information about the size and shape of your heart and how well your heart's chambers and valves are working. This procedure takes approximately one hour. There are no restrictions for this procedure.  Follow-Up: Your physician wants you to follow-up in: 1 year with Dr. Berry--after echo. You will receive a reminder letter in the mail two months in advance. If you don't receive a letter, please call our office to schedule the follow-up appointment.  If you need a refill on your cardiac medications before your next appointment, please call your pharmacy.  

## 2017-04-16 NOTE — Progress Notes (Signed)
04/16/2017 Allen Sanchez   [birth year]  [id number]  Primary Physician Lujean Amel, MD Primary Cardiologist: Lorretta Harp MD Garret Reddish, [place], [place]  HPI:  Allen Sanchez is a 72 y.o.  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 His only cardiac risk factor is treated hyperlipidemia. He does have GERD. He's never had a heart attack or stroke. He denies chest pain or shortness of breath. A recent 2-D echo performed 03/11/17 revealed normal systolic function with moderate aortic stenosis, aortic valve area 1.03 cm with a peak gradient of 51 mmHg.   Current Meds  Medication Sig  . acetaminophen (TYLENOL) 650 MG CR tablet Take 650 mg by mouth every 8 (eight) hours as needed for pain.  Marland Kitchen aspirin 81 MG EC tablet Take by mouth.  . celecoxib (CELEBREX) 200 MG capsule TK 1 C PO QD PRN FOR KNEE PAIN  . cholecalciferol (VITAMIN D) 1000 UNITS tablet Take 1,000 Units by mouth daily.  Marland Kitchen CIALIS 20 MG tablet   . CRESTOR 10 MG tablet   . meloxicam (MOBIC) 7.5 MG tablet   . Multiple Vitamin (MULTI VITAMIN DAILY) TABS Take by mouth.     Allergies  Allergen Reactions  . Crestor [Rosuvastatin] Other (See Comments)    muscle weakness   . Lipitor [Atorvastatin] Other (See Comments)    muscle weakness   . Pravastatin Other (See Comments)    myalgias  . Zetia [Ezetimibe] Other (See Comments)    muscle weakness     Social History   Socioeconomic History  . Marital status: Married    Spouse name: Not on file  . Number of children: Not on file  . Years of education: Not on file  . Highest education level: Not on file  Occupational History  . Not on file  Social Needs  . Financial resource strain: Not on file  . Food insecurity:    Worry: Not on file   Inability: Not on file  . Transportation needs:    Medical: Not on file    Non-medical: Not on file  Tobacco Use  . Smoking status: Never Smoker  . Smokeless tobacco: Never Used  Substance and Sexual Activity  . Alcohol use: Yes    Alcohol/week: 0.0 oz    Comment: 2 drinks per week(Sundays)  . Drug use: Not on file  . Sexual activity: Not on file  Lifestyle  . Physical activity:    Days per week: Not on file    Minutes per session: Not on file  . Stress: Not on file  Relationships  . Social connections:    Talks on phone: Not on file    Gets together: Not on file    Attends religious service: Not on file    Active member of club or organization: Not on file    Attends meetings of clubs or organizations: Not on file    Relationship status: Not on file  . Intimate partner violence:    Fear of current or ex partner: Not on file    Emotionally abused: Not on file    Physically abused: Not on file    Forced sexual activity: Not on file  Other Topics Concern  . Not on file  Social History Narrative  . Not on file  Review of Systems: General: negative for chills, fever, night sweats or weight changes.  Cardiovascular: negative for chest pain, dyspnea on exertion, edema, orthopnea, palpitations, paroxysmal nocturnal dyspnea or shortness of breath Dermatological: negative for rash Respiratory: negative for cough or wheezing Urologic: negative for hematuria Abdominal: negative for nausea, vomiting, diarrhea, bright red blood per rectum, melena, or hematemesis Neurologic: negative for visual changes, syncope, or dizziness All other systems reviewed and are otherwise negative except as noted above.    Height 5\' 3"  (1.6 m), weight 197 lb (89.4 kg).  General appearance: alert and no distress Neck: no adenopathy, no carotid bruit, no JVD, supple, symmetrical, trachea midline and thyroid not enlarged, symmetric, no tenderness/mass/nodules Lungs: clear to auscultation  bilaterally Heart: 2/6 outflow tract murmur consistent with aortic stenosis Extremities: extremities normal, atraumatic, no cyanosis or edema Pulses: 2+ and symmetric Skin: Skin color, texture, turgor normal. No rashes or lesions Neurologic: Alert and oriented X 3, normal strength and tone. Normal symmetric reflexes. Normal coordination and gait  EKG not performed today  ASSESSMENT AND PLAN:   Hyperlipidemia History of hyperlipidemia on statin therapy followed by her PCP  Moderate aortic stenosis History of moderate aortic stenosis by 2-D echo recently performed 03/11/17 revealing normal LV systolic function, and aortic valve area 1.16 cm with a peak gradient of 51 mmHg. This represents mild progression since the last echo performed 11/14/13 although the patient is completely asymptomatic.we will continue to follow this on an annual basis.      Lorretta Harp MD FACP,FACC,FAHA, [facility] 04/16/2017 4:14 PM

## 2017-04-16 NOTE — Assessment & Plan Note (Signed)
History of moderate aortic stenosis by 2-D echo recently performed 03/11/17 revealing normal LV systolic function, and aortic valve area 1.16 cm with a peak gradient of 51 mmHg. This represents mild progression since the last echo performed 11/14/13 although the patient is completely asymptomatic.we will continue to follow this on an annual basis.

## 2017-04-16 NOTE — Assessment & Plan Note (Signed)
History of hyperlipidemia on statin therapy followed by her PCP. 

## 2017-11-17 DIAGNOSIS — Z79899 Other long term (current) drug therapy: Secondary | ICD-10-CM | POA: Diagnosis not present

## 2017-11-17 DIAGNOSIS — I491 Atrial premature depolarization: Secondary | ICD-10-CM | POA: Diagnosis not present

## 2017-11-17 DIAGNOSIS — E7800 Pure hypercholesterolemia, unspecified: Secondary | ICD-10-CM | POA: Diagnosis not present

## 2017-11-17 DIAGNOSIS — E119 Type 2 diabetes mellitus without complications: Secondary | ICD-10-CM | POA: Diagnosis not present

## 2017-11-17 DIAGNOSIS — M1710 Unilateral primary osteoarthritis, unspecified knee: Secondary | ICD-10-CM | POA: Diagnosis not present

## 2017-11-26 DIAGNOSIS — M170 Bilateral primary osteoarthritis of knee: Secondary | ICD-10-CM | POA: Diagnosis not present

## 2018-01-09 DIAGNOSIS — M5412 Radiculopathy, cervical region: Secondary | ICD-10-CM | POA: Diagnosis not present

## 2018-02-08 DIAGNOSIS — L308 Other specified dermatitis: Secondary | ICD-10-CM | POA: Diagnosis not present

## 2018-03-10 DIAGNOSIS — H25812 Combined forms of age-related cataract, left eye: Secondary | ICD-10-CM | POA: Diagnosis not present

## 2018-03-10 DIAGNOSIS — H401113 Primary open-angle glaucoma, right eye, severe stage: Secondary | ICD-10-CM | POA: Diagnosis not present

## 2018-03-11 DIAGNOSIS — M170 Bilateral primary osteoarthritis of knee: Secondary | ICD-10-CM | POA: Diagnosis not present

## 2018-04-19 ENCOUNTER — Telehealth: Payer: Self-pay | Admitting: *Deleted

## 2018-04-19 NOTE — Telephone Encounter (Signed)
Patient call

## 2018-04-22 ENCOUNTER — Ambulatory Visit: Payer: BLUE CROSS/BLUE SHIELD | Admitting: Cardiovascular Disease

## 2018-07-04 DIAGNOSIS — M170 Bilateral primary osteoarthritis of knee: Secondary | ICD-10-CM | POA: Diagnosis not present

## 2018-07-12 NOTE — Addendum Note (Signed)
Addended by: Annita Brod on: 07/12/2018 11:59 AM   Modules accepted: Orders

## 2018-07-14 ENCOUNTER — Other Ambulatory Visit (HOSPITAL_COMMUNITY): Payer: BLUE CROSS/BLUE SHIELD

## 2018-07-19 ENCOUNTER — Ambulatory Visit (HOSPITAL_COMMUNITY): Payer: BC Managed Care – PPO | Attending: Cardiology

## 2018-07-19 ENCOUNTER — Other Ambulatory Visit: Payer: Self-pay

## 2018-07-19 DIAGNOSIS — I350 Nonrheumatic aortic (valve) stenosis: Secondary | ICD-10-CM | POA: Diagnosis not present

## 2018-07-20 ENCOUNTER — Other Ambulatory Visit: Payer: Self-pay

## 2018-07-20 DIAGNOSIS — I350 Nonrheumatic aortic (valve) stenosis: Secondary | ICD-10-CM

## 2018-07-21 ENCOUNTER — Encounter: Payer: Self-pay | Admitting: Cardiovascular Disease

## 2018-07-21 ENCOUNTER — Other Ambulatory Visit: Payer: Self-pay

## 2018-07-21 ENCOUNTER — Ambulatory Visit (INDEPENDENT_AMBULATORY_CARE_PROVIDER_SITE_OTHER): Payer: BC Managed Care – PPO | Admitting: Cardiovascular Disease

## 2018-07-21 ENCOUNTER — Telehealth: Payer: Self-pay | Admitting: Cardiovascular Disease

## 2018-07-21 DIAGNOSIS — I350 Nonrheumatic aortic (valve) stenosis: Secondary | ICD-10-CM | POA: Diagnosis not present

## 2018-07-21 DIAGNOSIS — E782 Mixed hyperlipidemia: Secondary | ICD-10-CM

## 2018-07-21 NOTE — Progress Notes (Signed)
07/21/2018 [place]   08-May-1945  [id number]  Primary Physician Lujean Amel, MD Primary Cardiologist: Lorretta Harp MD Gar[facility] Redd[facility], [place], [place]  HPI:  Allen Sanchez is a 73 y.o.  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  I last saw him in the office 04/16/2017.  His only cardiac risk factor is treated hyperlipidemia. He does have GERD. He's never had a heart attack or stroke. He denies chest pain or shortness of breath. A recent 2-D echo performed 03/11/17 revealed normal systolic function with moderate aortic stenosis, aortic valve area 1.03 cm with a peak gradient of 51 mmHg.  Since I saw him a year ago his remained completely asymptomatic.  Follow-up echo performed 07/19/2018 was unchanged.  He denies chest pain or shortness of breath.   Current Meds  Medication Sig  . acetaminophen (TYLENOL) 650 MG CR tablet Take 650 mg by mouth every 8 (eight) hours as needed for pain.  . celecoxib (CELEBREX) 200 MG capsule TK 1 C PO QD PRN FOR KNEE PAIN  . cholecalciferol (VITAMIN D) 1000 UNITS tablet Take 1,000 Units by mouth daily.  Marland Kitchen CIALIS 20 MG tablet   . CRESTOR 10 MG tablet   . meloxicam (MOBIC) 7.5 MG tablet   . Multiple Vitamin (MULTI VITAMIN DAILY) TABS Take by mouth.  . [DISCONTINUED] aspirin 81 MG EC tablet Take by mouth.     Allergies  Allergen Reactions  . Crestor [Rosuvastatin] Other (See Comments)    muscle weakness   . Lipitor [Atorvastatin] Other (See Comments)    muscle weakness   . Pravastatin Other (See Comments)    myalgias  . Zetia [Ezetimibe] Other (See Comments)    muscle weakness     Social History   Socioeconomic History  . Marital status: Married    Spouse name: Not on file  . Number of children: Not on file   . Years of education: Not on file  . Highest education level: Not on file  Occupational History  . Not on file  Social Needs  . Financial resource strain: Not on file  . Food insecurity    Worry: Not on file    Inability: Not on file  . Transportation needs    Medical: Not on file    Non-medical: Not on file  Tobacco Use  . Smoking status: Never Smoker  . Smokeless tobacco: Never Used  Substance and Sexual Activity  . Alcohol use: Yes    Alcohol/week: 0.0 standard drinks    Comment: 2 drinks per week(Sundays)  . Drug use: Not on file  . Sexual activity: Not on file  Lifestyle  . Physical activity    Days per week: Not on file    Minutes per session: Not on file  . Stress: Not on file  Relationships  . Social Herbalist on phone: Not on file    Gets together: Not on file    Attends religious service: Not on file    Active member of club or organization: Not on file    Attends meetings of clubs or organizations: Not on file    Relationship status: Not on file  . Intimate partner violence    Fear of current or ex partner: Not on file  Emotionally abused: Not on file    Physically abused: Not on file    Forced sexual activity: Not on file  Other Topics Concern  . Not on file  Social History Narrative  . Not on file     Review of Systems: General: negative for chills, fever, night sweats or weight changes.  Cardiovascular: negative for chest pain, dyspnea on exertion, edema, orthopnea, palpitations, paroxysmal nocturnal dyspnea or shortness of breath Dermatological: negative for rash Respiratory: negative for cough or wheezing Urologic: negative for hematuria Abdominal: negative for nausea, vomiting, diarrhea, bright red blood per rectum, melena, or hematemesis Neurologic: negative for visual changes, syncope, or dizziness All other systems reviewed and are otherwise negative except as noted above.    Pulse 81, temperature (!) 97.2 F (36.2 C),  height 5\' 3"  (1.6 m), weight 179 lb 6.4 oz (81.4 kg), SpO2 99 %.  General appearance: alert and no distress Neck: no adenopathy, no carotid bruit, no JVD, supple, symmetrical, trachea midline and thyroid not enlarged, symmetric, no tenderness/mass/nodules Lungs: clear to auscultation bilaterally Heart: 2/6 systolic murmur at the base consistent with aortic stenosis. Extremities: extremities normal, atraumatic, no cyanosis or edema Pulses: 2+ and symmetric Skin: Skin color, texture, turgor normal. No rashes or lesions Neurologic: Alert and oriented X 3, normal strength and tone. Normal symmetric reflexes. Normal coordination and gait  EKG sinus rhythm at 63 with marked sinus arrhythmia.  I personally reviewed this EKG.  ASSESSMENT AND PLAN:   Hyperlipidemia History of hyperlipidemia on Crestor with lipid profile performed 05/20/2017 revealing total cholesterol 187, LDL of 105 and HDL 69.  Moderate aortic stenosis History of moderate aortic stenosis with recent echo performed 07/19/2018 revealing normal LV function with moderate AS unchanged from prior echo.  He is completely asymptomatic.  Will repeat again in 12 months.      Lorretta Harp MD FACP,FACC,FAHA, [facility] 07/21/2018 4:59 PM

## 2018-07-21 NOTE — Telephone Encounter (Signed)
LVM, reminding pt of his appt with Dr Gwenlyn Found on 07-21-18.

## 2018-07-21 NOTE — Assessment & Plan Note (Signed)
History of hyperlipidemia on Crestor with lipid profile performed 05/20/2017 revealing total cholesterol 187, LDL of 105 and HDL 69.

## 2018-07-21 NOTE — Patient Instructions (Signed)
Medication Instructions:  Your physician recommends that you continue on your current medications as directed. Please refer to the Current Medication list given to you today.  If you need a refill on your cardiac medications before your next appointment, please call your pharmacy.   Lab work: Your physician recommends that you return for lab work within 1-2 weeks: lipid and liver panels If you have labs (blood work) drawn today and your tests are completely normal, you will receive your results only by: Marland Kitchen MyChart Message (if you have MyChart) OR . A paper copy in the mail If you have any lab test that is abnormal or we need to change your treatment, we will call you to review the results.  Testing/Procedures: Your physician has requested that you have an echocardiogram. Echocardiography is a painless test that uses sound waves to create images of your heart. It provides your doctor with information about the size and shape of your heart and how well your heart's chambers and valves are working. This procedure takes approximately one hour. There are no restrictions for this procedure. TO BE SCHEDULED FOR APPROXIMATELY 12 MONTHS FROM TODAY. YOU WILL BE CONTACTED BY A SCHEDULER TO SET UP THIS APPOINTMENT.   Follow-Up: At [facility], you and your health needs are our priority.  As part of our continuing mission to provide you with exceptional heart care, we have created designated Provider Care Teams.  These Care Teams include your primary Cardiologist (physician) and Advanced Practice Providers (APPs -  Physician Assistants and Nurse Practitioners) who all work together to provide you with the care you need, when you need it. You will need a follow up appointment in 12 months WITH DR. Gwenlyn Found.  Please call our office 2 months in advance to schedule this appointment.

## 2018-07-21 NOTE — Assessment & Plan Note (Signed)
History of moderate aortic stenosis with recent echo performed 07/19/2018 revealing normal LV function with moderate AS unchanged from prior echo.  He is completely asymptomatic.  Will repeat again in 12 months.

## 2018-08-16 DIAGNOSIS — Z125 Encounter for screening for malignant neoplasm of prostate: Secondary | ICD-10-CM | POA: Diagnosis not present

## 2018-08-16 DIAGNOSIS — E119 Type 2 diabetes mellitus without complications: Secondary | ICD-10-CM | POA: Diagnosis not present

## 2018-08-16 DIAGNOSIS — E7800 Pure hypercholesterolemia, unspecified: Secondary | ICD-10-CM | POA: Diagnosis not present

## 2018-08-16 DIAGNOSIS — Z6833 Body mass index (BMI) 33.0-33.9, adult: Secondary | ICD-10-CM | POA: Diagnosis not present

## 2018-08-16 DIAGNOSIS — Z0000 Encounter for general adult medical examination without abnormal findings: Secondary | ICD-10-CM | POA: Diagnosis not present

## 2018-08-16 DIAGNOSIS — Z79899 Other long term (current) drug therapy: Secondary | ICD-10-CM | POA: Diagnosis not present

## 2018-08-23 DIAGNOSIS — N521 Erectile dysfunction due to diseases classified elsewhere: Secondary | ICD-10-CM | POA: Diagnosis not present

## 2018-08-23 DIAGNOSIS — N430 Encysted hydrocele: Secondary | ICD-10-CM | POA: Diagnosis not present

## 2018-08-23 DIAGNOSIS — Z125 Encounter for screening for malignant neoplasm of prostate: Secondary | ICD-10-CM | POA: Diagnosis not present

## 2018-09-16 DIAGNOSIS — M170 Bilateral primary osteoarthritis of knee: Secondary | ICD-10-CM | POA: Diagnosis not present

## 2018-10-06 DIAGNOSIS — H401113 Primary open-angle glaucoma, right eye, severe stage: Secondary | ICD-10-CM | POA: Diagnosis not present

## 2019-01-10 DIAGNOSIS — M170 Bilateral primary osteoarthritis of knee: Secondary | ICD-10-CM | POA: Diagnosis not present

## 2019-01-12 DIAGNOSIS — H401113 Primary open-angle glaucoma, right eye, severe stage: Secondary | ICD-10-CM | POA: Diagnosis not present

## 2019-01-12 DIAGNOSIS — H40002 Preglaucoma, unspecified, left eye: Secondary | ICD-10-CM | POA: Diagnosis not present

## 2019-02-24 ENCOUNTER — Telehealth: Payer: Self-pay | Admitting: Cardiovascular Disease

## 2019-02-24 NOTE — Telephone Encounter (Signed)
Left message for patient to inform him we do recommend the Covid 19 vaccination and that he will be monitored following the vaccine for any reaction. Requested patient call back with any further questions.

## 2019-02-24 NOTE — Telephone Encounter (Signed)
Per call from pt is a little concerned about getting the C19 vaccine and would like a call back pt stated if you get the voice mail it is Ok to leave a message.

## 2019-03-03 DIAGNOSIS — Z23 Encounter for immunization: Secondary | ICD-10-CM | POA: Diagnosis not present

## 2019-03-31 DIAGNOSIS — Z23 Encounter for immunization: Secondary | ICD-10-CM | POA: Diagnosis not present

## 2019-04-26 DIAGNOSIS — L300 Nummular dermatitis: Secondary | ICD-10-CM | POA: Diagnosis not present

## 2019-04-26 DIAGNOSIS — N481 Balanitis: Secondary | ICD-10-CM | POA: Diagnosis not present

## 2019-05-11 DIAGNOSIS — M170 Bilateral primary osteoarthritis of knee: Secondary | ICD-10-CM | POA: Diagnosis not present

## 2019-05-12 DIAGNOSIS — H401113 Primary open-angle glaucoma, right eye, severe stage: Secondary | ICD-10-CM | POA: Diagnosis not present

## 2019-06-14 DIAGNOSIS — H401113 Primary open-angle glaucoma, right eye, severe stage: Secondary | ICD-10-CM | POA: Diagnosis not present

## 2019-06-14 DIAGNOSIS — M545 Low back pain: Secondary | ICD-10-CM | POA: Diagnosis not present

## 2019-06-14 DIAGNOSIS — M48061 Spinal stenosis, lumbar region without neurogenic claudication: Secondary | ICD-10-CM | POA: Diagnosis not present

## 2019-06-23 DIAGNOSIS — M545 Low back pain: Secondary | ICD-10-CM | POA: Diagnosis not present

## 2019-06-23 DIAGNOSIS — M48061 Spinal stenosis, lumbar region without neurogenic claudication: Secondary | ICD-10-CM | POA: Diagnosis not present

## 2019-07-04 DIAGNOSIS — L603 Nail dystrophy: Secondary | ICD-10-CM | POA: Diagnosis not present

## 2019-07-04 DIAGNOSIS — B351 Tinea unguium: Secondary | ICD-10-CM | POA: Diagnosis not present

## 2019-07-06 DIAGNOSIS — Z125 Encounter for screening for malignant neoplasm of prostate: Secondary | ICD-10-CM | POA: Diagnosis not present

## 2019-07-06 DIAGNOSIS — R7309 Other abnormal glucose: Secondary | ICD-10-CM | POA: Diagnosis not present

## 2019-07-06 DIAGNOSIS — M1710 Unilateral primary osteoarthritis, unspecified knee: Secondary | ICD-10-CM | POA: Diagnosis not present

## 2019-07-06 DIAGNOSIS — N529 Male erectile dysfunction, unspecified: Secondary | ICD-10-CM | POA: Diagnosis not present

## 2019-07-06 DIAGNOSIS — E7800 Pure hypercholesterolemia, unspecified: Secondary | ICD-10-CM | POA: Diagnosis not present

## 2019-07-06 DIAGNOSIS — Z0000 Encounter for general adult medical examination without abnormal findings: Secondary | ICD-10-CM | POA: Diagnosis not present

## 2019-07-06 DIAGNOSIS — I350 Nonrheumatic aortic (valve) stenosis: Secondary | ICD-10-CM | POA: Diagnosis not present

## 2019-07-06 DIAGNOSIS — M5136 Other intervertebral disc degeneration, lumbar region: Secondary | ICD-10-CM | POA: Diagnosis not present

## 2019-07-20 DIAGNOSIS — M722 Plantar fascial fibromatosis: Secondary | ICD-10-CM | POA: Diagnosis not present

## 2019-07-28 ENCOUNTER — Ambulatory Visit (HOSPITAL_COMMUNITY): Payer: BC Managed Care – PPO | Attending: Cardiology

## 2019-07-28 ENCOUNTER — Other Ambulatory Visit: Payer: Self-pay

## 2019-07-28 DIAGNOSIS — I350 Nonrheumatic aortic (valve) stenosis: Secondary | ICD-10-CM | POA: Insufficient documentation

## 2019-07-28 LAB — ECHOCARDIOGRAM COMPLETE
AR max vel: 0.66 cm2
AV Area VTI: 0.76 cm2
AV Area mean vel: 0.76 cm2
AV Mean grad: 33 mmHg
AV Peak grad: 57.8 mmHg
Ao pk vel: 3.8 m/s
Area-P 1/2: 3.85 cm2
P 1/2 time: 1023 msec
S' Lateral: 3.2 cm

## 2019-08-10 ENCOUNTER — Encounter: Payer: Self-pay | Admitting: Cardiovascular Disease

## 2019-08-10 ENCOUNTER — Ambulatory Visit (INDEPENDENT_AMBULATORY_CARE_PROVIDER_SITE_OTHER): Payer: BC Managed Care – PPO | Admitting: Cardiovascular Disease

## 2019-08-10 ENCOUNTER — Other Ambulatory Visit: Payer: Self-pay

## 2019-08-10 DIAGNOSIS — E782 Mixed hyperlipidemia: Secondary | ICD-10-CM

## 2019-08-10 DIAGNOSIS — I350 Nonrheumatic aortic (valve) stenosis: Secondary | ICD-10-CM | POA: Diagnosis not present

## 2019-08-10 NOTE — Patient Instructions (Signed)
Medication Instructions:  Your physician recommends that you continue on your current medications as directed. Please refer to the Current Medication list given to you today.  *If you need a refill on your cardiac medications before your next appointment, please call your pharmacy*  Lab Work: NONE ordered at this time of appointment   If you have labs (blood work) drawn today and your tests are completely normal, you will receive your results only by:  [place] (if you have MyChart) OR  A paper copy in the mail If you have any lab test that is abnormal or we need to change your treatment, we will call you to review the results.  Testing/Procedures: Your physician has requested that you have an echocardiogram. Echocardiography is a painless test that uses sound waves to create images of your heart. It provides your doctor with information about the size and shape of your heart and how well your hearts chambers and valves are working. This procedure takes approximately one hour. There are no restrictions for this procedure.   Please schedule for July 2022   Follow-Up: At [facility], you and your health needs are our priority.  As part of our continuing mission to provide you with exceptional heart care, we have created designated Provider Care Teams.  These Care Teams include your primary Cardiologist (physician) and Advanced Practice Providers (APPs -  Physician Assistants and Nurse Practitioners) who all work together to provide you with the care you need, when you need it.  We recommend signing up for the patient portal called "MyChart".  Sign up information is provided on this After Visit Summary.  MyChart is used to connect with patients for Virtual Visits (Telemedicine).  Patients are able to view lab/test results, encounter notes, upcoming appointments, etc.  Non-urgent messages can be sent to your provider as well.   To learn more about what you can do with MyChart, go to  [email or web address].    Your next appointment:   6 month(s)  The format for your next appointment:   In Person  Provider:   Quay Burow, MD  Other Instructions

## 2019-08-10 NOTE — Progress Notes (Signed)
08/10/2019 [place]   01/02/1946  [id number]  Primary Physician Lujean Amel, MD Primary Cardiologist: Lorretta Harp MD [facility] Redd[facility], [place], [place]  HPI:  Allen Sanchez is a 74 y.o.  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  I last saw him in the office 07/21/2018.  His only cardiac risk factor is treated hyperlipidemia. He does have GERD. He's never had a heart attack or stroke. He denies chest pain or shortness of breath. A recent 2-D echo performed 03/11/17 revealed normal systolic function with moderate aortic stenosis, aortic valve area 1.03 cm with a peak gradient of 51 mmHg.  Since I saw him a year ago his remained completely asymptomatic.  Follow-up echo performed 07/21/2018 revealed progression of his aortic stenosis from a valve area of 1.24 cm down to 0.76 cm although he remains asymptomatic.  Current Meds  Medication Sig  . acetaminophen (TYLENOL) 650 MG CR tablet Take 650 mg by mouth every 8 (eight) hours as needed for pain.  . celecoxib (CELEBREX) 200 MG capsule TK 1 C PO QD PRN FOR KNEE PAIN  . cholecalciferol (VITAMIN D) 1000 UNITS tablet Take 1,000 Units by mouth daily.  Marland Kitchen CIALIS 20 MG tablet   . CRESTOR 10 MG tablet   . dorzolamide-timolol (COSOPT) 22.3-6.8 MG/ML ophthalmic solution Place 1 drop into the right eye 2 (two) times daily.  . meloxicam (MOBIC) 7.5 MG tablet   . Multiple Vitamin (MULTI VITAMIN DAILY) TABS Take by mouth.  . terbinafine (LAMISIL) 250 MG tablet Take 250 mg by mouth daily.  Marland Kitchen VYZULTA 0.024 % SOLN SMARTSIG:1 Drop(s) In Eye(s) Every Evening     Allergies  Allergen Reactions  . Crestor [Rosuvastatin] Other (See Comments)    muscle weakness   . Lipitor [Atorvastatin] Other (See Comments)    muscle  weakness   . Pravastatin Other (See Comments)    myalgias  . Zetia [Ezetimibe] Other (See Comments)    muscle weakness     Social History   Socioeconomic History  . Marital status: Married    Spouse name: Not on file  . Number of children: Not on file  . Years of education: Not on file  . Highest education level: Not on file  Occupational History  . Not on file  Tobacco Use  . Smoking status: Never Smoker  . Smokeless tobacco: Never Used  Substance and Sexual Activity  . Alcohol use: Yes    Alcohol/week: 0.0 standard drinks    Comment: 2 drinks per week(Sundays)  . Drug use: Not on file  . Sexual activity: Not on file  Other Topics Concern  . Not on file  Social History Narrative  . Not on file   Social Determinants of Health   Financial Resource Strain:   . Difficulty of Paying Living Expenses:   Food Insecurity:   . Worried About Charity fundraiser in the Last Year:   . Arboriculturist in the Last Year:   Transportation Needs:   . Film/video editor (Medical):   Marland Kitchen Lack of Transportation (Non-Medical):   Physical Activity:   . Days of Exercise per Week:   . Minutes of Exercise per Session:   Stress:   . Feeling of Stress :   Social Connections:   .  Frequency of Communication with Friends and Family:   . Frequency of Social Gatherings with Friends and Family:   . Attends Religious Services:   . Active Member of Clubs or Organizations:   . Attends Archivist Meetings:   Marland Kitchen Marital Status:   Intimate Partner Violence:   . Fear of Current or Ex-Partner:   . Emotionally Abused:   Marland Kitchen Physically Abused:   . Sexually Abused:      Review of Systems: General: negative for chills, fever, night sweats or weight changes.  Cardiovascular: negative for chest pain, dyspnea on exertion, edema, orthopnea, palpitations, paroxysmal nocturnal dyspnea or shortness of breath Dermatological: negative for rash Respiratory: negative for cough or wheezing Urologic:  negative for hematuria Abdominal: negative for nausea, vomiting, diarrhea, bright red blood per rectum, melena, or hematemesis Neurologic: negative for visual changes, syncope, or dizziness All other systems reviewed and are otherwise negative except as noted above.    Blood pressure (!) 142/76, pulse (!) 55, height 5\' 3"  (1.6 m), weight 171 lb 12.8 oz (77.9 kg), SpO2 100 %.  General appearance: alert and no distress Neck: no adenopathy, no carotid bruit, no JVD, supple, symmetrical, trachea midline and thyroid not enlarged, symmetric, no tenderness/mass/nodules Lungs: clear to auscultation bilaterally Heart: 2/6 systolic ejection murmur at the base consistent with aortic stenosis. Extremities: extremities normal, atraumatic, no cyanosis or edema Pulses: 2+ and symmetric Skin: Skin color, texture, turgor normal. No rashes or lesions Neurologic: Alert and oriented X 3, normal strength and tone. Normal symmetric reflexes. Normal coordination and gait  EKG not performed today  ASSESSMENT AND PLAN:   Hyperlipidemia History of hyperlipidemia on Crestor lipid profile performed 07/06/2019 revealing total cholesterol 195, LDL 114 and HDL of 67.  Moderate aortic stenosis History of moderate aortic stenosis with recent 2D echo performed 07/28/2019 revealing progression of stenosis from a valve area area of 1.24 down to 0.76 cm.  He is totally asymptomatic however.  I am going to repeat his 2D echo and 12 months.  I will see him back in 6 months for follow-up.      Lorretta Harp MD FACP,FACC,FAHA, [facility] 08/10/2019 5:04 PM

## 2019-08-10 NOTE — Assessment & Plan Note (Signed)
History of hyperlipidemia on Crestor lipid profile performed 07/06/2019 revealing total cholesterol 195, LDL 114 and HDL of 67.

## 2019-08-10 NOTE — Assessment & Plan Note (Signed)
History of moderate aortic stenosis with recent 2D echo performed 07/28/2019 revealing progression of stenosis from a valve area area of 1.24 down to 0.76 cm.  He is totally asymptomatic however.  I am going to repeat his 2D echo and 12 months.  I will see him back in 6 months for follow-up.

## 2019-09-05 DIAGNOSIS — L309 Dermatitis, unspecified: Secondary | ICD-10-CM | POA: Diagnosis not present

## 2019-09-05 DIAGNOSIS — B351 Tinea unguium: Secondary | ICD-10-CM | POA: Diagnosis not present

## 2019-09-14 DIAGNOSIS — M48061 Spinal stenosis, lumbar region without neurogenic claudication: Secondary | ICD-10-CM | POA: Diagnosis not present

## 2019-09-14 DIAGNOSIS — M545 Low back pain: Secondary | ICD-10-CM | POA: Diagnosis not present

## 2019-09-20 DIAGNOSIS — M170 Bilateral primary osteoarthritis of knee: Secondary | ICD-10-CM | POA: Diagnosis not present

## 2020-01-09 ENCOUNTER — Telehealth: Payer: Self-pay | Admitting: Cardiovascular Disease

## 2020-01-09 NOTE — Telephone Encounter (Signed)
Pt wondering when to have his next echo done.  Last echo done 07/28/19, per Dr. Hazle Coca note echo to be repeated 12 months from date of last echo. Explained this to pt. Told pt that this appointment can be made if he would like to go ahead and he states that he would like to schedule it so he doesn't forget.  Will send pt to scheduling.

## 2020-01-09 NOTE — Telephone Encounter (Signed)
Allen Sanchez is calling wanting to know when Dr. Allyson Sabal is wanting his next Echo performed. Please advise.

## 2020-01-19 DIAGNOSIS — H401113 Primary open-angle glaucoma, right eye, severe stage: Secondary | ICD-10-CM | POA: Diagnosis not present

## 2020-02-02 DIAGNOSIS — M47812 Spondylosis without myelopathy or radiculopathy, cervical region: Secondary | ICD-10-CM | POA: Diagnosis not present

## 2020-02-02 DIAGNOSIS — M170 Bilateral primary osteoarthritis of knee: Secondary | ICD-10-CM | POA: Diagnosis not present

## 2020-02-14 DIAGNOSIS — L84 Corns and callosities: Secondary | ICD-10-CM | POA: Diagnosis not present

## 2020-02-14 DIAGNOSIS — D2272 Melanocytic nevi of left lower limb, including hip: Secondary | ICD-10-CM | POA: Diagnosis not present

## 2020-02-14 DIAGNOSIS — D485 Neoplasm of uncertain behavior of skin: Secondary | ICD-10-CM | POA: Diagnosis not present

## 2020-02-23 DIAGNOSIS — M48061 Spinal stenosis, lumbar region without neurogenic claudication: Secondary | ICD-10-CM | POA: Diagnosis not present

## 2020-04-30 DIAGNOSIS — D122 Benign neoplasm of ascending colon: Secondary | ICD-10-CM | POA: Diagnosis not present

## 2020-04-30 DIAGNOSIS — D120 Benign neoplasm of cecum: Secondary | ICD-10-CM | POA: Diagnosis not present

## 2020-04-30 DIAGNOSIS — Z86010 Personal history of colonic polyps: Secondary | ICD-10-CM | POA: Diagnosis not present

## 2020-05-30 DIAGNOSIS — M170 Bilateral primary osteoarthritis of knee: Secondary | ICD-10-CM | POA: Diagnosis not present

## 2020-06-13 ENCOUNTER — Other Ambulatory Visit: Payer: Self-pay

## 2020-06-13 DIAGNOSIS — I350 Nonrheumatic aortic (valve) stenosis: Secondary | ICD-10-CM

## 2020-07-18 DIAGNOSIS — Z125 Encounter for screening for malignant neoplasm of prostate: Secondary | ICD-10-CM | POA: Diagnosis not present

## 2020-07-18 DIAGNOSIS — Z0000 Encounter for general adult medical examination without abnormal findings: Secondary | ICD-10-CM | POA: Diagnosis not present

## 2020-07-18 DIAGNOSIS — E7800 Pure hypercholesterolemia, unspecified: Secondary | ICD-10-CM | POA: Diagnosis not present

## 2020-07-18 DIAGNOSIS — R7309 Other abnormal glucose: Secondary | ICD-10-CM | POA: Diagnosis not present

## 2020-08-21 DIAGNOSIS — N481 Balanitis: Secondary | ICD-10-CM | POA: Diagnosis not present

## 2020-08-21 DIAGNOSIS — L821 Other seborrheic keratosis: Secondary | ICD-10-CM | POA: Diagnosis not present

## 2020-09-16 DIAGNOSIS — M170 Bilateral primary osteoarthritis of knee: Secondary | ICD-10-CM | POA: Diagnosis not present

## 2020-09-30 ENCOUNTER — Other Ambulatory Visit: Payer: Self-pay

## 2020-09-30 ENCOUNTER — Ambulatory Visit (HOSPITAL_COMMUNITY): Payer: BC Managed Care – PPO | Attending: Cardiology

## 2020-09-30 DIAGNOSIS — I350 Nonrheumatic aortic (valve) stenosis: Secondary | ICD-10-CM | POA: Diagnosis not present

## 2020-09-30 LAB — ECHOCARDIOGRAM COMPLETE
AR max vel: 1.24 cm2
AV Area mean vel: 1.32 cm2
AV Mean grad: 40.5 mmHg
AV Peak grad: 65.6 mmHg
Ao pk vel: 4.05 m/s
Area-P 1/2: 3.92 cm2
P 1/2 time: 478 msec
S' Lateral: 2.9 cm

## 2020-10-08 ENCOUNTER — Other Ambulatory Visit: Payer: Self-pay

## 2020-10-08 ENCOUNTER — Ambulatory Visit (INDEPENDENT_AMBULATORY_CARE_PROVIDER_SITE_OTHER): Payer: BC Managed Care – PPO | Admitting: Cardiovascular Disease

## 2020-10-08 ENCOUNTER — Encounter: Payer: Self-pay | Admitting: Cardiovascular Disease

## 2020-10-08 VITALS — BP 128/66 | HR 65 | Ht 63.0 in | Wt 174.0 lb

## 2020-10-08 DIAGNOSIS — I350 Nonrheumatic aortic (valve) stenosis: Secondary | ICD-10-CM

## 2020-10-08 DIAGNOSIS — R0989 Other specified symptoms and signs involving the circulatory and respiratory systems: Secondary | ICD-10-CM

## 2020-10-08 DIAGNOSIS — E782 Mixed hyperlipidemia: Secondary | ICD-10-CM | POA: Diagnosis not present

## 2020-10-08 NOTE — Patient Instructions (Signed)
Medication Instructions:  Your physician recommends that you continue on your current medications as directed. Please refer to the Current Medication list given to you today.  *If you need a refill on your cardiac medications before your next appointment, please call your pharmacy*   Testing/Procedures: Your physician has requested that you have a carotid duplex. This test is an ultrasound of the carotid arteries in your neck. It looks at blood flow through these arteries that supply the brain with blood. Allow one hour for this exam. There are no restrictions or special instructions. This procedure is done at [facility].   Dr. Gwenlyn Found has ordered a CT coronary calcium score. This test is done at [address]. This is $99 out of pocket.   Coronary CalciumScan A coronary calcium scan is an imaging test used to look for deposits of calcium and other fatty materials (plaques) in the inner lining of the blood vessels of the heart (coronary arteries). These deposits of calcium and plaques can partly clog and narrow the coronary arteries without producing any symptoms or warning signs. This puts a person at risk for a heart attack. This test can detect these deposits before symptoms develop. Tell a health care provider about: Any allergies you have. All medicines you are taking, including vitamins, herbs, eye drops, creams, and over-the-counter medicines. Any problems you or family members have had with anesthetic medicines. Any blood disorders you have. Any surgeries you have had. Any medical conditions you have. Whether you are pregnant or may be pregnant. What are the risks? Generally, this is a safe procedure. However, problems may occur, including: Harm to a pregnant woman and her unborn baby. This test involves the use of radiation. Radiation exposure can be dangerous to a pregnant woman and her unborn baby. If you are pregnant, you generally should not have this  procedure done. Slight increase in the risk of cancer. This is because of the radiation involved in the test. What happens before the procedure? No preparation is needed for this procedure. What happens during the procedure? You will undress and remove any jewelry around your neck or chest. You will put on a hospital gown. Sticky electrodes will be placed on your chest. The electrodes will be connected to an electrocardiogram (ECG) machine to record a tracing of the electrical activity of your heart. A CT scanner will take pictures of your heart. During this time, you will be asked to lie still and hold your breath for 2-3 seconds while a picture of your heart is being taken. The procedure may vary among health care providers and hospitals. What happens after the procedure? You can get dressed. You can return to your normal activities. It is up to you to get the results of your test. Ask your health care provider, or the department that is doing the test, when your results will be ready. Summary A coronary calcium scan is an imaging test used to look for deposits of calcium and other fatty materials (plaques) in the inner lining of the blood vessels of the heart (coronary arteries). Generally, this is a safe procedure. Tell your health care provider if you are pregnant or may be pregnant. No preparation is needed for this procedure. A CT scanner will take pictures of your heart. You can return to your normal activities after the scan is done. This information is not intended to replace advice given to you by your health care provider. Make sure you discuss any  questions you have with your health care provider. Document Released: 06/20/2007 Document Revised: 11/11/2015 Document Reviewed: 11/11/2015 Elsevier Interactive Patient Education  2017 [place] physician has requested that you have an echocardiogram. Echocardiography is a painless test that uses sound waves to create images  of your heart. It provides your doctor with information about the size and shape of your heart and how well your heart's chambers and valves are working. This procedure takes approximately one hour. There are no restrictions for this procedure. To be done in Sept 2023. This procedure is done at [address].    Follow-Up: At [facility], you and your health needs are our priority.  As part of our continuing mission to provide you with exceptional heart care, we have created designated Provider Care Teams.  These Care Teams include your primary Cardiologist (physician) and Advanced Practice Providers (APPs -  Physician Assistants and Nurse Practitioners) who all work together to provide you with the care you need, when you need it.  We recommend signing up for the patient portal called "MyChart".  Sign up information is provided on this After Visit Summary.  MyChart is used to connect with patients for Virtual Visits (Telemedicine).  Patients are able to view lab/test results, encounter notes, upcoming appointments, etc.  Non-urgent messages can be sent to your provider as well.   To learn more about what you can do with MyChart, go to [email or web address].    Your next appointment:   12 month(s)  The format for your next appointment:   In Person  Provider:   Quay Burow, MD

## 2020-10-08 NOTE — Assessment & Plan Note (Signed)
History of moderate aortic stenosis which has progressed since I last checked 07/28/2019.  He now has severe aortic stenosis with a mean gradient is increased from 33 to 40 mmHg.  He remains asymptomatic.  We will continue to follow him on an annual basis.

## 2020-10-08 NOTE — Assessment & Plan Note (Signed)
History of hyperlipidemia on low-dose rosuvastatin with lipid profile performed 07/18/2020 revealing a total cholesterol of 217, LDL 125 and HDL 78.  And going to get a coronary calcium score to help determine aggressiveness of risk factor modification.

## 2020-10-08 NOTE — Progress Notes (Signed)
10/08/2020 Allen Sanchez   08-16-1945  [id number]  Primary Physician Lujean Amel, MD Primary Cardiologist: Lorretta Harp MD Allen Sanchez, [place], [place]  HPI:  Allen Sanchez is a 75 y.o.   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  I last saw him in the office 07/21/2018.  His only cardiac risk factor is treated hyperlipidemia. He does have GERD. He's never had a heart attack or stroke. He denies chest pain or shortness of breath. A recent 2-D echo performed 03/11/17 revealed normal systolic function with moderate aortic stenosis, aortic valve area 1.03 cm with a peak gradient of 51 mmHg.   Since I saw him a year ago his remained completely asymptomatic.  Follow-up echo performed 09/30/2020 revealed progression of his aortic stenosis with a mean gradient that is increased from 33 to 40 mmHg.  He remains asymptomatic.  Current Meds  Medication Sig   acetaminophen (TYLENOL) 650 MG CR tablet Take 650 mg by mouth every 8 (eight) hours as needed for pain.   celecoxib (CELEBREX) 200 MG capsule TK 1 C PO QD PRN FOR KNEE PAIN   cholecalciferol (VITAMIN D) 1000 UNITS tablet Take 1,000 Units by mouth daily.   CIALIS 20 MG tablet    CRESTOR 10 MG tablet    dorzolamide-timolol (COSOPT) 22.3-6.8 MG/ML ophthalmic solution Place 1 drop into the right eye 2 (two) times daily.   meloxicam (MOBIC) 7.5 MG tablet    Multiple Vitamin (MULTI VITAMIN DAILY) TABS Take by mouth.   terbinafine (LAMISIL) 250 MG tablet Take 250 mg by mouth daily.   VYZULTA 0.024 % SOLN SMARTSIG:1 Drop(s) In Eye(s) Every Evening     Allergies  Allergen Reactions   Crestor [Rosuvastatin] Other (See Comments)    muscle weakness    Lipitor [Atorvastatin] Other (See Comments)    muscle weakness     Pravastatin Other (See Comments)    myalgias   Zetia [Ezetimibe] Other (See Comments)    muscle weakness     Social History   Socioeconomic History   Marital status: Married    Spouse name: Not on file   Number of children: Not on file   Years of education: Not on file   Highest education level: Not on file  Occupational History   Not on file  Tobacco Use   Smoking status: Never   Smokeless tobacco: Never  Substance and Sexual Activity   Alcohol use: Yes    Alcohol/week: 0.0 standard drinks    Comment: 2 drinks per week(Sundays)   Drug use: Not on file   Sexual activity: Not on file  Other Topics Concern   Not on file  Social History Narrative   Not on file   Social Determinants of Health   Financial Resource Strain: Not on file  Food Insecurity: Not on file  Transportation Needs: Not on file  Physical Activity: Not on file  Stress: Not on file  Social Connections: Not on file  Intimate Partner Violence: Not on file     Review of Systems: General: negative for chills, fever, night sweats or weight changes.  Cardiovascular: negative for chest pain, dyspnea on exertion, edema, orthopnea, palpitations, paroxysmal nocturnal dyspnea or shortness of breath Dermatological: negative for rash Respiratory: negative for cough or wheezing Urologic: negative  for hematuria Abdominal: negative for nausea, vomiting, diarrhea, bright red blood per rectum, melena, or hematemesis Neurologic: negative for visual changes, syncope, or dizziness All other systems reviewed and are otherwise negative except as noted above.    Blood pressure 128/66, pulse 65, height 5\' 3"  (1.6 m), weight 174 lb (78.9 kg).  General appearance: alert and no distress Neck: no adenopathy, no JVD, supple, symmetrical, trachea midline, thyroid not enlarged, symmetric, no tenderness/mass/nodules, and bilateral carotid bruits versus transmitted murmur Lungs: clear to auscultation bilaterally Heart: 3/6  systolic ejection murmur, high-pitched, at the base consistent with severe aortic stenosis. Extremities: extremities normal, atraumatic, no cyanosis or edema Pulses: 2+ and symmetric Skin: Skin color, texture, turgor normal. No rashes or lesions Neurologic: Grossly normal  EKG sinus rhythm at 65 with frequent PACs and borderline LVH.  I personally reviewed this EKG.  ASSESSMENT AND PLAN:   Hyperlipidemia History of hyperlipidemia on low-dose rosuvastatin with lipid profile performed 07/18/2020 revealing a total cholesterol of 217, LDL 125 and HDL 78.  And going to get a coronary calcium score to help determine aggressiveness of risk factor modification.  Moderate aortic stenosis History of moderate aortic stenosis which has progressed since I last checked 07/28/2019.  He now has severe aortic stenosis with a mean gradient is increased from 33 to 40 mmHg.  He remains asymptomatic.  We will continue to follow him on an annual basis.     Lorretta Harp MD FACP,FACC,FAHA, [facility] 10/08/2020 9:39 AM

## 2020-10-09 ENCOUNTER — Ambulatory Visit (HOSPITAL_COMMUNITY)
Admission: RE | Admit: 2020-10-09 | Discharge: 2020-10-09 | Disposition: A | Discharge status: Home / Self Care | Payer: BC Managed Care – PPO | Source: Ambulatory Visit | Attending: Cardiovascular Disease | Admitting: Cardiovascular Disease

## 2020-10-09 DIAGNOSIS — R0989 Other specified symptoms and signs involving the circulatory and respiratory systems: Secondary | ICD-10-CM | POA: Insufficient documentation

## 2020-10-22 DIAGNOSIS — L821 Other seborrheic keratosis: Secondary | ICD-10-CM | POA: Diagnosis not present

## 2020-10-22 DIAGNOSIS — L300 Nummular dermatitis: Secondary | ICD-10-CM | POA: Diagnosis not present

## 2020-10-29 ENCOUNTER — Ambulatory Visit (INDEPENDENT_AMBULATORY_CARE_PROVIDER_SITE_OTHER)
Admission: RE | Admit: 2020-10-29 | Discharge: 2020-10-29 | Disposition: A | Discharge status: Home / Self Care | Payer: Self-pay | Source: Ambulatory Visit | Attending: Cardiovascular Disease | Admitting: Cardiovascular Disease

## 2020-10-29 ENCOUNTER — Other Ambulatory Visit: Payer: Self-pay

## 2020-10-29 DIAGNOSIS — E782 Mixed hyperlipidemia: Secondary | ICD-10-CM

## 2020-11-01 ENCOUNTER — Telehealth: Payer: Self-pay

## 2020-11-01 DIAGNOSIS — E782 Mixed hyperlipidemia: Secondary | ICD-10-CM

## 2020-11-01 MED ORDER — ROSUVASTATIN CALCIUM 40 MG PO TABS: 40.0000 mg | ORAL_TABLET | Freq: Every day | ORAL | 1 refills | Status: DC

## 2020-11-01 NOTE — Telephone Encounter (Signed)
Spoke with pt regarding elevated coronary calcium score. Per Dr. Gwenlyn Found we will increase crestor to 40mg  once daily. Pt made aware that we will repeat lab work in 3 months. Pt verbalizes understanding. All questions answered. Prescription sent and orders placed for labs.

## 2020-11-01 NOTE — Telephone Encounter (Addendum)
Erroneous encounter

## 2020-11-01 NOTE — Telephone Encounter (Signed)
-----   Message from Lorretta Harp, MD sent at 10/30/2020  6:35 AM EDT ----- Given elevated CCS, increase Crestor to 40 mg and re check FLP 3 months

## 2020-11-01 NOTE — Addendum Note (Signed)
Addended by: Beatrix Fetters on: 11/01/2020 03:19 PM   Modules accepted: Orders

## 2020-11-01 NOTE — Telephone Encounter (Signed)
Left message for pt to call back  °

## 2021-01-17 DIAGNOSIS — M170 Bilateral primary osteoarthritis of knee: Secondary | ICD-10-CM | POA: Diagnosis not present

## 2021-01-24 DIAGNOSIS — E782 Mixed hyperlipidemia: Secondary | ICD-10-CM

## 2021-03-11 DIAGNOSIS — H401113 Primary open-angle glaucoma, right eye, severe stage: Secondary | ICD-10-CM | POA: Diagnosis not present

## 2021-03-11 DIAGNOSIS — H2513 Age-related nuclear cataract, bilateral: Secondary | ICD-10-CM | POA: Diagnosis not present

## 2021-04-22 DIAGNOSIS — M170 Bilateral primary osteoarthritis of knee: Secondary | ICD-10-CM | POA: Diagnosis not present

## 2021-04-30 ENCOUNTER — Other Ambulatory Visit: Payer: Self-pay | Admitting: Cardiovascular Disease

## 2021-05-14 DIAGNOSIS — H2513 Age-related nuclear cataract, bilateral: Secondary | ICD-10-CM | POA: Diagnosis not present

## 2021-05-14 DIAGNOSIS — H401113 Primary open-angle glaucoma, right eye, severe stage: Secondary | ICD-10-CM | POA: Diagnosis not present

## 2021-07-23 DIAGNOSIS — Z113 Encounter for screening for infections with a predominantly sexual mode of transmission: Secondary | ICD-10-CM | POA: Diagnosis not present

## 2021-07-23 DIAGNOSIS — N529 Male erectile dysfunction, unspecified: Secondary | ICD-10-CM | POA: Diagnosis not present

## 2021-07-23 DIAGNOSIS — E7800 Pure hypercholesterolemia, unspecified: Secondary | ICD-10-CM | POA: Diagnosis not present

## 2021-07-23 DIAGNOSIS — Z79899 Other long term (current) drug therapy: Secondary | ICD-10-CM | POA: Diagnosis not present

## 2021-07-23 DIAGNOSIS — I350 Nonrheumatic aortic (valve) stenosis: Secondary | ICD-10-CM | POA: Diagnosis not present

## 2021-07-23 DIAGNOSIS — N433 Hydrocele, unspecified: Secondary | ICD-10-CM | POA: Diagnosis not present

## 2021-07-23 DIAGNOSIS — Z0000 Encounter for general adult medical examination without abnormal findings: Secondary | ICD-10-CM | POA: Diagnosis not present

## 2021-07-23 DIAGNOSIS — D696 Thrombocytopenia, unspecified: Secondary | ICD-10-CM | POA: Diagnosis not present

## 2021-07-23 DIAGNOSIS — D72818 Other decreased white blood cell count: Secondary | ICD-10-CM | POA: Diagnosis not present

## 2021-07-23 DIAGNOSIS — R7303 Prediabetes: Secondary | ICD-10-CM | POA: Diagnosis not present

## 2021-07-23 DIAGNOSIS — M1710 Unilateral primary osteoarthritis, unspecified knee: Secondary | ICD-10-CM | POA: Diagnosis not present

## 2021-07-24 DIAGNOSIS — M170 Bilateral primary osteoarthritis of knee: Secondary | ICD-10-CM | POA: Diagnosis not present

## 2021-07-30 ENCOUNTER — Other Ambulatory Visit: Payer: Self-pay | Admitting: Family Medicine

## 2021-07-30 DIAGNOSIS — D696 Thrombocytopenia, unspecified: Secondary | ICD-10-CM

## 2021-07-30 DIAGNOSIS — R350 Frequency of micturition: Secondary | ICD-10-CM | POA: Diagnosis not present

## 2021-08-15 ENCOUNTER — Ambulatory Visit
Admission: RE | Admit: 2021-08-15 | Discharge: 2021-08-15 | Disposition: A | Discharge status: Home / Self Care | Payer: BC Managed Care – PPO | Source: Ambulatory Visit | Attending: Family Medicine | Admitting: Family Medicine

## 2021-08-15 DIAGNOSIS — D696 Thrombocytopenia, unspecified: Secondary | ICD-10-CM | POA: Diagnosis not present

## 2021-08-15 DIAGNOSIS — Q631 Lobulated, fused and horseshoe kidney: Secondary | ICD-10-CM | POA: Diagnosis not present

## 2021-08-20 DIAGNOSIS — H401113 Primary open-angle glaucoma, right eye, severe stage: Secondary | ICD-10-CM | POA: Diagnosis not present

## 2021-08-26 ENCOUNTER — Ambulatory Visit (HOSPITAL_COMMUNITY): Payer: BC Managed Care – PPO | Attending: Cardiology

## 2021-08-26 DIAGNOSIS — I350 Nonrheumatic aortic (valve) stenosis: Secondary | ICD-10-CM | POA: Diagnosis not present

## 2021-08-26 LAB — ECHOCARDIOGRAM COMPLETE
AR max vel: 0.9 cm2
AV Area VTI: 0.98 cm2
AV Area mean vel: 0.89 cm2
AV Mean grad: 46 mmHg
AV Peak grad: 82.3 mmHg
Ao pk vel: 4.54 m/s
Area-P 1/2: 3 cm2
MV M vel: 3.08 m/s
MV Peak grad: 37.9 mmHg
S' Lateral: 2.5 cm

## 2021-09-05 ENCOUNTER — Other Ambulatory Visit (HOSPITAL_COMMUNITY): Payer: BC Managed Care – PPO

## 2021-10-08 DIAGNOSIS — M47816 Spondylosis without myelopathy or radiculopathy, lumbar region: Secondary | ICD-10-CM | POA: Diagnosis not present

## 2021-10-08 DIAGNOSIS — M48061 Spinal stenosis, lumbar region without neurogenic claudication: Secondary | ICD-10-CM | POA: Diagnosis not present

## 2021-10-16 DIAGNOSIS — M545 Low back pain, unspecified: Secondary | ICD-10-CM | POA: Diagnosis not present

## 2021-10-21 ENCOUNTER — Ambulatory Visit: Payer: BC Managed Care – PPO | Attending: Cardiovascular Disease | Admitting: Cardiovascular Disease

## 2021-10-21 ENCOUNTER — Encounter: Payer: Self-pay | Admitting: Cardiovascular Disease

## 2021-10-21 ENCOUNTER — Telehealth: Payer: Self-pay | Admitting: Pharmacist

## 2021-10-21 DIAGNOSIS — M47816 Spondylosis without myelopathy or radiculopathy, lumbar region: Secondary | ICD-10-CM | POA: Diagnosis not present

## 2021-10-21 DIAGNOSIS — I350 Nonrheumatic aortic (valve) stenosis: Secondary | ICD-10-CM

## 2021-10-21 DIAGNOSIS — E782 Mixed hyperlipidemia: Secondary | ICD-10-CM | POA: Diagnosis not present

## 2021-10-21 DIAGNOSIS — R931 Abnormal findings on diagnostic imaging of heart and coronary circulation: Secondary | ICD-10-CM | POA: Diagnosis not present

## 2021-10-21 DIAGNOSIS — M48061 Spinal stenosis, lumbar region without neurogenic claudication: Secondary | ICD-10-CM | POA: Diagnosis not present

## 2021-10-21 NOTE — Telephone Encounter (Signed)
PA for Repatha submitted.  Key: [id number]

## 2021-10-21 NOTE — Assessment & Plan Note (Signed)
History of hyperlipidemia on high-dose Crestor with lipid profile performed 07/23/2021 revealing total cholesterol 187, LDL 95 and HDL 75.  He is not at goal for secondary prevention given his elevated coronary calcium score.  I am going to begin him on a PCSK9 and down titrate his Crestor.  LDL goal less than 70.Marland Kitchen

## 2021-10-21 NOTE — Progress Notes (Signed)
10/21/2021 Allen Sanchez   [birth year]  [id number]  Primary Physician Lujean Amel, MD Primary Cardiologist: Lorretta Harp MD Garret Reddish, [place], [place]  HPI:  Allen Sanchez is a 76 y.o.    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  I last saw him in the office 07/21/2018.  His only cardiac risk factor is treated hyperlipidemia. He does have GERD. He's never had a heart attack or stroke. He denies chest pain or shortness of breath. A recent 2-D echo performed 03/11/17 revealed normal systolic function with moderate aortic stenosis, aortic valve area 1.03 cm with a peak gradient of 51 mmHg.   Since I saw him a year ago his remained completely asymptomatic.  Follow-up echo performed 08/26/2021 revealed normal LV systolic function, progression of his aortic stenosis from moderate to moderately severe with a valve area that has gone from 1.24 cm down to 0.9 cm with this mild increase in mean gradient from 40 to 46 mmHg.  He remains completely asymptomatic.  He did have a coronary calcium score performed 10/29/2020 which was 163 with calcium in all 3 coronary arteries.  He denies chest pain.   Current Meds  Medication Sig   acetaminophen (TYLENOL) 650 MG CR tablet Take 650 mg by mouth every 8 (eight) hours as needed for pain.   brimonidine (ALPHAGAN) 0.2 % ophthalmic solution    celecoxib (CELEBREX) 200 MG capsule TK 1 C PO QD PRN FOR KNEE PAIN   CIALIS 20 MG tablet    dorzolamide-timolol (COSOPT) 22.3-6.8 MG/ML ophthalmic solution Place 1 drop into the right eye 2 (two) times daily.   ketoconazole (NIZORAL) 2 % cream    latanoprost (XALATAN) 0.005 % ophthalmic solution SMARTSIG:1 Drop(s) In Eye(s) Every Evening   meloxicam (MOBIC) 7.5 MG tablet     Multiple Vitamin (MULTI VITAMIN DAILY) TABS Take by mouth.   rosuvastatin (CRESTOR) 40 MG tablet Take 1 tablet (40 mg total) by mouth daily. Please schedule an appointment for future refills   terbinafine (LAMISIL) 250 MG tablet Take 250 mg by mouth daily.   VYZULTA 0.024 % SOLN SMARTSIG:1 Drop(s) In Eye(s) Every Evening   [DISCONTINUED] cholecalciferol (VITAMIN D) 1000 UNITS tablet Take 1,000 Units by mouth daily.     Allergies  Allergen Reactions   Crestor [Rosuvastatin] Other (See Comments)    muscle weakness    Lipitor [Atorvastatin] Other (See Comments)    muscle weakness    Pravastatin Other (See Comments)    myalgias   Zetia [Ezetimibe] Other (See Comments)    muscle weakness     Social History   Socioeconomic History   Marital status: Married    Spouse name: Not on file   Number of children: Not on file   Years of education: Not on file   Highest education level: Not on file  Occupational History   Not on file  Tobacco Use   Smoking status: Never   Smokeless tobacco: Never  Substance and Sexual Activity   Alcohol use: Yes    Alcohol/week: 0.0 standard drinks of alcohol    Comment: 2 drinks per week(Sundays)   Drug use: Not on file   Sexual activity: Not on file  Other Topics Concern   Not on file  Social History Narrative  Not on file   Social Determinants of Health   Financial Resource Strain: Not on file  Food Insecurity: Not on file  Transportation Needs: Not on file  Physical Activity: Not on file  Stress: Not on file  Social Connections: Not on file  Intimate Partner Violence: Not on file     Review of Systems: General: negative for chills, fever, night sweats or weight changes.  Cardiovascular: negative for chest pain, dyspnea on exertion, edema, orthopnea, palpitations, paroxysmal nocturnal dyspnea or shortness of breath Dermatological: negative for rash Respiratory: negative for cough or wheezing Urologic: negative for hematuria Abdominal:  negative for nausea, vomiting, diarrhea, bright red blood per rectum, melena, or hematemesis Neurologic: negative for visual changes, syncope, or dizziness All other systems reviewed and are otherwise negative except as noted above.    Blood pressure 118/72, pulse (!) 58, weight 177 lb 12.8 oz (80.6 kg), SpO2 98 %.  General appearance: alert and no distress Neck: no adenopathy, no carotid bruit, no JVD, supple, symmetrical, trachea midline, and thyroid not enlarged, symmetric, no tenderness/mass/nodules Lungs: clear to auscultation bilaterally Heart: 2/6 systolic ejection murmur at the base consistent with aortic stenosis. Extremities: extremities normal, atraumatic, no cyanosis or edema Pulses: 2+ and symmetric Skin: Skin color, texture, turgor normal. No rashes or lesions Neurologic: Grossly normal  EKG sinus bradycardia 58 with PACs.  I personally reviewed this EKG.  ASSESSMENT AND PLAN:   Hyperlipidemia History of hyperlipidemia on high-dose Crestor with lipid profile performed 07/23/2021 revealing total cholesterol 187, LDL 95 and HDL 75.  He is not at goal for secondary prevention given his elevated coronary calcium score.  I am going to begin him on a PCSK9 and down titrate his Crestor.  LDL goal less than 70..  Moderate aortic stenosis 2D echo has shown progression of his aortic stenosis from moderate to moderately severe by 2D echo performed 08/26/2021.  LV function was normal.  His aortic valve area is now 0.9 cm which is decreased from 1.24 cm and his mean gradient is increased to 46 mmHg.  He still remains completely asymptomatic.  We will continue to follow him biannual 2D echocardiogram.  Elevated coronary artery calcium score Coronary calcium score performed 10/29/2020 was 163 with calcium in all 3 coronary arteries.  He denies chest pain.  Based on this, I decided to be more aggressive with lipid management with an LDL goal of less than 70.     Lorretta Harp MD  FACP,FACC,FAHA, [facility] 10/21/2021 3:44 PM

## 2021-10-21 NOTE — Patient Instructions (Addendum)
Medication Instructions:  Your physician recommends that you continue on your current medications as directed. Please refer to the Current Medication list given to you today.  *If you need a refill on your cardiac medications before your next appointment, please call your pharmacy*   Testing/Procedures: Your physician has requested that you have an echocardiogram. Echocardiography is a painless test that uses sound waves to create images of your heart. It provides your doctor with information about the size and shape of your heart and how well your heart's chambers and valves are working. This procedure takes approximately one hour. There are no restrictions for this procedure. Please do NOT wear cologne, perfume, aftershave, or lotions (deodorant is allowed). Please arrive 15 minutes prior to your appointment time. To be done in August 2024. This procedure will be done at [address]   Follow-Up: At [facility], you and your health needs are our priority.  As part of our continuing mission to provide you with exceptional heart care, we have created designated Provider Care Teams.  These Care Teams include your primary Cardiologist (physician) and Advanced Practice Providers (APPs -  Physician Assistants and Nurse Practitioners) who all work together to provide you with the care you need, when you need it.  We recommend signing up for the patient portal called "MyChart".  Sign up information is provided on this After Visit Summary.  MyChart is used to connect with patients for Virtual Visits (Telemedicine).  Patients are able to view lab/test results, encounter notes, upcoming appointments, etc.  Non-urgent messages can be sent to your provider as well.   To learn more about what you can do with MyChart, go to [email or web address].    Your next appointment:   12 month(s)  The format for your next appointment:   In Person  Provider:   Quay Burow, MD

## 2021-10-21 NOTE — Assessment & Plan Note (Signed)
2D echo has shown progression of his aortic stenosis from moderate to moderately severe by 2D echo performed 08/26/2021.  LV function was normal.  His aortic valve area is now 0.9 cm which is decreased from 1.24 cm and his mean gradient is increased to 46 mmHg.  He still remains completely asymptomatic.  We will continue to follow him biannual 2D echocardiogram.

## 2021-10-21 NOTE — Assessment & Plan Note (Signed)
Coronary calcium score performed 10/29/2020 was 163 with calcium in all 3 coronary arteries.  He denies chest pain.  Based on this, I decided to be more aggressive with lipid management with an LDL goal of less than 70.

## 2021-10-24 ENCOUNTER — Other Ambulatory Visit: Payer: Self-pay | Admitting: Cardiovascular Disease

## 2021-10-31 NOTE — Telephone Encounter (Signed)
Repatha denied by [facility], they require coronary calcium score > 300.   Patient has listed allergy to ezetimibe.    LMOM telling patient medication not covered.

## 2021-11-13 DIAGNOSIS — M47816 Spondylosis without myelopathy or radiculopathy, lumbar region: Secondary | ICD-10-CM | POA: Diagnosis not present

## 2021-12-12 DIAGNOSIS — H401113 Primary open-angle glaucoma, right eye, severe stage: Secondary | ICD-10-CM | POA: Diagnosis not present

## 2021-12-24 DIAGNOSIS — M47816 Spondylosis without myelopathy or radiculopathy, lumbar region: Secondary | ICD-10-CM | POA: Diagnosis not present

## 2021-12-24 DIAGNOSIS — M48061 Spinal stenosis, lumbar region without neurogenic claudication: Secondary | ICD-10-CM | POA: Diagnosis not present

## 2022-01-27 DIAGNOSIS — Z79899 Other long term (current) drug therapy: Secondary | ICD-10-CM | POA: Diagnosis not present

## 2022-01-27 DIAGNOSIS — R7303 Prediabetes: Secondary | ICD-10-CM | POA: Diagnosis not present

## 2022-01-27 DIAGNOSIS — D696 Thrombocytopenia, unspecified: Secondary | ICD-10-CM | POA: Diagnosis not present

## 2022-02-28 ENCOUNTER — Encounter (HOSPITAL_COMMUNITY): Payer: Self-pay

## 2022-02-28 ENCOUNTER — Ambulatory Visit (HOSPITAL_COMMUNITY)
Admission: EM | Admit: 2022-02-28 | Discharge: 2022-02-28 | Disposition: A | Discharge status: Home / Self Care | Payer: BC Managed Care – PPO | Attending: Emergency Medicine | Admitting: Emergency Medicine

## 2022-02-28 DIAGNOSIS — J069 Acute upper respiratory infection, unspecified: Secondary | ICD-10-CM | POA: Diagnosis not present

## 2022-02-28 DIAGNOSIS — R001 Bradycardia, unspecified: Secondary | ICD-10-CM | POA: Diagnosis not present

## 2022-02-28 LAB — POC INFLUENZA A AND B ANTIGEN (URGENT CARE ONLY)
INFLUENZA A ANTIGEN, POC: NEGATIVE
INFLUENZA B ANTIGEN, POC: NEGATIVE

## 2022-02-28 MED ORDER — GUAIFENESIN 100 MG/5ML PO LIQD: 10.0000 mL | ORAL | 0 refills | Status: DC | PRN

## 2022-02-28 MED ORDER — CETIRIZINE HCL 10 MG PO TABS: 5.0000 mg | ORAL_TABLET | Freq: Every day | ORAL | 2 refills | Status: DC

## 2022-02-28 NOTE — Discharge Instructions (Signed)
If cough worsens you can use the Robitussin cough syrup.  You can take half tablet of zyrtec daily for sneezing and runny nose  Please go to the ED if you have any severe or worsening symptoms, especially weakness, lightheadedness, dizziness, shortness of breath, or chest pain

## 2022-02-28 NOTE — ED Triage Notes (Signed)
Pt is here for sneezing, chills, cough runny nose, congestion , possible body sches, headaches x 1day

## 2022-02-28 NOTE — ED Provider Notes (Signed)
[place]    CSN: [id number] Arrival date & time: 02/28/22  1250     History   Chief Complaint Chief Complaint  Patient presents with   Cough   Otalgia    HPI Allen Sanchez is a 77 y.o. male.  Here with flu-like symptoms that began yesterday Chills, runny nose, slight cough, some body aches Also sneezing No temps taken Has not taken any meds No sick contacts. Wife had covid 2 weeks ago  Resting HR per patient is usually around 33 Monitor was reading 119 at first Manual palpated by this provider was 77 He sees cardiology   Past Medical History:  Diagnosis Date   Aortic valve calcification    mild   Arthritis    knee   Diabetes mellitus without complication (HCC)    Eczema    ED (erectile dysfunction)    GERD (gastroesophageal reflux disease)    Horseshoe kidney    Hyperlipidemia    Neuromuscular disorder (HCC)    Sciatic nerve pain greater left -under physical therapy now    Patient Active Problem List   Diagnosis Date Noted   Elevated coronary artery calcium score 10/21/2021   Moderate aortic stenosis 04/16/2017   Murmur, cardiac 11/13/2013   Overweight 11/13/2013   PAC (premature atrial contraction) 11/13/2013   Hyperlipidemia 11/13/2013    Past Surgical History:  Procedure Laterality Date   COLONOSCOPY WITH PROPOFOL N/A 05/29/2014   Procedure: COLONOSCOPY WITH PROPOFOL;  Surgeon: Garlan Fair, MD;  Location: [facility];  Service: Endoscopy;  Laterality: N/A;   Home Medications    Prior to Admission medications   Medication Sig Start Date End Date Taking? Authorizing Provider  acetaminophen (TYLENOL) 650 MG CR tablet Take 650 mg by mouth every 8 (eight) hours as needed for pain.   Yes [provider]  cetirizine (ZYRTEC ALLERGY) 10 MG tablet Take 0.5 tablets (5 mg total) by mouth daily. 02/28/22  Yes Leann Mayweather, Wells Guiles, PA-C  CIALIS 20 MG tablet  08/25/13  Yes [provider]  guaiFENesin (ROBITUSSIN) 100 MG/5ML  liquid Take 10 mLs by mouth every 4 (four) hours as needed. 02/28/22  Yes Rodrigo Mcgranahan, Wells Guiles, PA-C  ketoconazole (NIZORAL) 2 % cream  08/21/20  Yes [provider]  latanoprost (XALATAN) 0.005 % ophthalmic solution SMARTSIG:1 Drop(s) In Eye(s) Every Evening 09/02/21  Yes [provider]  Multiple Vitamin (MULTI VITAMIN DAILY) TABS Take by mouth.   Yes [provider]  rosuvastatin (CRESTOR) 40 MG tablet Take 1 tablet (40 mg total) by mouth daily. 10/24/21  Yes Lorretta Harp, MD  VYZULTA 0.024 % SOLN SMARTSIG:1 Drop(s) In Eye(s) Every Evening 08/05/19  Yes [provider]  brimonidine (ALPHAGAN) 0.2 % ophthalmic solution  10/17/21   [provider]  meloxicam (MOBIC) 7.5 MG tablet  10/29/13   [provider]    Family History Family History  Problem Relation Age of Onset   Aneurysm Mother    Stroke Father     Social History Social History   Tobacco Use   Smoking status: Never   Smokeless tobacco: Never  Substance Use Topics   Alcohol use: Yes    Alcohol/week: 0.0 standard drinks of alcohol    Comment: 2 drinks per week(Sundays)     Allergies   Crestor [rosuvastatin], Lipitor [atorvastatin], Pravastatin, and Zetia [ezetimibe]   Review of Systems Review of Systems As per HPI  Physical Exam Triage Vital Signs ED Triage Vitals  Enc Vitals Group  BP 02/28/22 1503 119/86     Pulse Rate 02/28/22 1503 (!) 119     Resp 02/28/22 1503 16     Temp 02/28/22 1503 99.1 F (37.3 C)     Temp Source 02/28/22 1503 Oral     SpO2 02/28/22 1503 95 %     Weight --      Height --      Head Circumference --      Peak Flow --      Pain Score 02/28/22 1507 0     Pain Loc --      Pain Edu? --      Excl. in [place]? --    No data found.  Updated Vital Signs BP 119/86 (BP Location: Left Arm)   Pulse (!) 48 Comment: palpated by provider  Temp 99.1 F (37.3 C) (Oral)   Resp 16   SpO2 95%   Physical Exam Vitals and nursing note  reviewed.  Constitutional:      General: He is not in acute distress.    Appearance: He is not ill-appearing.  HENT:     Right Ear: Tympanic membrane and ear canal normal.     Left Ear: Tympanic membrane and ear canal normal.     Nose: No congestion or rhinorrhea.     Mouth/Throat:     Mouth: Mucous membranes are moist.     Pharynx: Oropharynx is clear. No posterior oropharyngeal erythema.  Eyes:     Conjunctiva/sclera: Conjunctivae normal.  Cardiovascular:     Rate and Rhythm: Bradycardia present. Rhythm irregular.     Heart sounds: Murmur heard.     Comments: 48 bpm palpated R radial Pulmonary:     Effort: Pulmonary effort is normal.     Breath sounds: Normal breath sounds.     Comments: Clear lungs Musculoskeletal:        General: Normal range of motion.     Cervical back: Normal range of motion.  Skin:    General: Skin is warm and dry.  Neurological:     Mental Status: He is alert and oriented to person, place, and time.     UC Treatments / Results  Labs (all labs ordered are listed, but only abnormal results are displayed) Labs Reviewed  POC INFLUENZA A AND B ANTIGEN (URGENT CARE ONLY)    EKG  Radiology No results found.  Procedures Procedures (including critical care time)  Medications Ordered in UC Medications - No data to display  Initial Impression / Assessment and Plan / UC Course  I have reviewed the triage vital signs and the nursing notes.  Pertinent labs & imaging results that were available during my care of the patient were reviewed by me and considered in my medical decision making (see chart for details).  Well appearing. Reports he is feeling better today without intervention  Rapid flu A/B negative Discussed viral etiology, symptomatic care as needed Cough is not bad but patient requesting cough syrup to have on hand in case it worsens. Recommend robitussin HR is 48 - normal for patient. He does not have concerning symptoms. Discussed ED  precautions. Patient agrees to plan  Final Clinical Impressions(s) / UC Diagnoses   Final diagnoses:  Viral URI  Asymptomatic bradycardia     Discharge Instructions      If cough worsens you can use the Robitussin cough syrup.  You can take half tablet of zyrtec daily for sneezing and runny nose  Please go to the ED if you  have any severe or worsening symptoms, especially weakness, lightheadedness, dizziness, shortness of breath, or chest pain    ED Prescriptions     Medication Sig Dispense Auth. Provider   guaiFENesin (ROBITUSSIN) 100 MG/5ML liquid Take 10 mLs by mouth every 4 (four) hours as needed. 60 mL Barnell Shieh, PA-C   cetirizine (ZYRTEC ALLERGY) 10 MG tablet Take 0.5 tablets (5 mg total) by mouth daily. 30 tablet Kaveri Perras, Wells Guiles, PA-C      PDMP not reviewed this encounter.   Les Pou, [place] 02/28/22 1743

## 2022-03-02 DIAGNOSIS — N481 Balanitis: Secondary | ICD-10-CM | POA: Diagnosis not present

## 2022-03-02 DIAGNOSIS — L858 Other specified epidermal thickening: Secondary | ICD-10-CM | POA: Diagnosis not present

## 2022-03-12 DIAGNOSIS — M5416 Radiculopathy, lumbar region: Secondary | ICD-10-CM | POA: Diagnosis not present

## 2022-03-13 ENCOUNTER — Encounter: Payer: Self-pay | Admitting: General Practice

## 2022-03-13 ENCOUNTER — Ambulatory Visit: Payer: BC Managed Care – PPO | Attending: General Practice | Admitting: General Practice

## 2022-03-13 VITALS — BP 120/68 | HR 51 | Ht 63.0 in | Wt 179.0 lb

## 2022-03-13 DIAGNOSIS — I350 Nonrheumatic aortic (valve) stenosis: Secondary | ICD-10-CM | POA: Diagnosis not present

## 2022-03-13 DIAGNOSIS — E782 Mixed hyperlipidemia: Secondary | ICD-10-CM

## 2022-03-13 DIAGNOSIS — R931 Abnormal findings on diagnostic imaging of heart and coronary circulation: Secondary | ICD-10-CM | POA: Diagnosis not present

## 2022-03-13 NOTE — Progress Notes (Signed)
Cardiology Clinic Note   Patient Name: Allen Sanchez Date of Encounter: 03/13/2022  Primary Care Provider:  Lujean Amel, MD Primary Cardiologist:  Quay Burow, MD  Patient Profile    Allen Sanchez 77 year old male presents the clinic today for follow-up evaluation of his PACs, cardiac murmur, and bradycardia.  Past Medical History    Past Medical History:  Diagnosis Date   Aortic valve calcification    mild   Arthritis    knee   Diabetes mellitus without complication (HCC)    Eczema    ED (erectile dysfunction)    GERD (gastroesophageal reflux disease)    Horseshoe kidney    Hyperlipidemia    Neuromuscular disorder (HCC)    Sciatic nerve pain greater left -under physical therapy now   Past Surgical History:  Procedure Laterality Date   COLONOSCOPY WITH PROPOFOL N/A 05/29/2014   Procedure: COLONOSCOPY WITH PROPOFOL;  Surgeon: Garlan Fair, MD;  Location: [facility];  Service: Endoscopy;  Laterality: N/A;    Allergies  Allergies  Allergen Reactions   Crestor [Rosuvastatin] Other (See Comments)    muscle weakness    Lipitor [Atorvastatin] Other (See Comments)    muscle weakness    Pravastatin Other (See Comments)    myalgias   Zetia [Ezetimibe] Other (See Comments)    muscle weakness     History of Present Illness    Allen Sanchez has a PMH of elevated coronary artery calcium score, PACs, cardiac murmur, being overweight, hyperlipidemia, and low heart rate.  Echocardiogram 3/19 showed normal systolic function with moderate aortic stenosis and aortic valve area of 1.03 cm and peak gradient of 51 mmHg  He was seen in follow-up by Dr. Gwenlyn Found on 10/21/2021.  He remained stable from a cardiac standpoint.  His echocardiogram 08/26/2021 showed normal LV function, progressive aortic stenosis from moderate to moderately severe with aortic valve area of 1.24 cm.  He was also noted to have a mildly increased mean gradient of 40 to 46 mmHg.  He remained  asymptomatic.  He underwent coronary calcium scoring 10/29/2020 which showed a coronary calcium score of 163 and noted calcium in all 3 coronary arteries.  He presented to the emergency department on 02/28/2022 with flulike symptoms.  He reported that his symptoms had started the day before.  He noted runny nose, chills, cough, and bodyaches.  He denied sick contacts but reported that his wife had a COVID infection 2 weeks prior.  His resting heart rate was usually around 38.  In the clinic his heart rate was noted to be 48.  His blood pressure was 119/86.  His lungs were noted to be clear.  His flu swab was negative.  He was diagnosed with viral infection and he was educated about this.  He was instructed to take Robitussin for his cough and Zyrtec for his sneezing and runny nose.  He denied cardiac symptoms.  He presents to the clinic today for follow-up evaluation and states he feels well.  We reviewed his recent urgent care visit and he expressed understanding.  He reports that he has had a low heart rate for quite some time.  He occasionally has episodes where he feels dizzy.  These occur when he stands up quickly.  He reports that he needs to increase his hydration.  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  He continues to try to stay in shape with regular physical activity and follows a  low-sodium diet.  I have asked him to increase his p.o. hydration.  We reviewed his echocardiogram and I will have him repeat his echocardiogram in August.  We will plan follow-up with Dr.Berry after his echo.  Today he denies chest pain, shortness of breath, lower extremity edema, fatigue, palpitations, melena, hematuria, hemoptysis, diaphoresis, weakness, presyncope, syncope, orthopnea, and PND.   Home Medications    Prior to Admission medications   Medication Sig Start Date End Date Taking? Authorizing Provider  acetaminophen (TYLENOL) 650 MG CR tablet Take 650 mg by mouth every 8  (eight) hours as needed for pain.    [provider]  brimonidine (ALPHAGAN) 0.2 % ophthalmic solution  10/17/21   [provider]  cetirizine (ZYRTEC ALLERGY) 10 MG tablet Take 0.5 tablets (5 mg total) by mouth daily. 02/28/22   Rising, Wells Guiles, PA-C  CIALIS 20 MG tablet  08/25/13   [provider]  guaiFENesin (ROBITUSSIN) 100 MG/5ML liquid Take 10 mLs by mouth every 4 (four) hours as needed. 02/28/22   Rising, Wells Guiles, PA-C  ketoconazole (NIZORAL) 2 % cream  08/21/20   [provider]  latanoprost (XALATAN) 0.005 % ophthalmic solution SMARTSIG:1 Drop(s) In Eye(s) Every Evening 09/02/21   [provider]  meloxicam (MOBIC) 7.5 MG tablet  10/29/13   [provider]  Multiple Vitamin (MULTI VITAMIN DAILY) TABS Take by mouth.    [provider]  rosuvastatin (CRESTOR) 40 MG tablet Take 1 tablet (40 mg total) by mouth daily. 10/24/21   Lorretta Harp, MD  VYZULTA 0.024 % SOLN SMARTSIG:1 Drop(s) In Eye(s) Every Evening 08/05/19   [provider]    Family History    Family History  Problem Relation Age of Onset   Aneurysm Mother    Stroke Father    He indicated that his mother is deceased. He indicated that his father is deceased. He indicated that all of his three sisters are alive. He indicated that both of his brothers are alive.  Social History    Social History   Socioeconomic History   Marital status: Married    Spouse name: Not on file   Number of children: Not on file   Years of education: Not on file   Highest education level: Not on file  Occupational History   Not on file  Tobacco Use   Smoking status: Never   Smokeless tobacco: Never  Substance and Sexual Activity   Alcohol use: Yes    Alcohol/week: 0.0 standard drinks of alcohol    Comment: 2 drinks per week(Sundays)   Drug use: Not on file   Sexual activity: Not on file  Other Topics Concern   Not on file  Social History Narrative   Not on  file   Social Determinants of Health   Financial Resource Strain: Not on file  Food Insecurity: Not on file  Transportation Needs: Not on file  Physical Activity: Not on file  Stress: Not on file  Social Connections: Not on file  Intimate Partner Violence: Not on file     Review of Systems    General:  No chills, fever, night sweats or weight changes.  Cardiovascular:  No chest pain, dyspnea on exertion, edema, orthopnea, palpitations, paroxysmal nocturnal dyspnea. Dermatological: No rash, lesions/masses Respiratory: No cough, dyspnea Urologic: No hematuria, dysuria Abdominal:   No nausea, vomiting, diarrhea, bright red blood per rectum, melena, or hematemesis Neurologic:  No visual changes, wkns, changes in mental status. All other systems reviewed and are  otherwise negative except as noted above.  Physical Exam    VS:  BP 120/68 (BP Location: Left Arm, Patient Position: Sitting, Cuff Size: Normal)   Pulse (!) 51   Ht '5\' 3"'$  (1.6 m)   Wt 179 lb (81.2 kg)   SpO2 98%   BMI 31.71 kg/m  , BMI Body mass index is 31.71 kg/m. GEN: Well nourished, well developed, in no acute distress. HEENT: normal. Neck: Supple, no JVD, carotid bruits, or masses. Cardiac: RRR, no murmurs, rubs, or gallops. No clubbing, cyanosis, edema.  Radials/DP/PT 2+ and equal bilaterally.  Respiratory:  Respirations regular and unlabored, clear to auscultation bilaterally. GI: Soft, nontender, nondistended, BS + x 4. MS: no deformity or atrophy. Skin: warm and dry, no rash. Neuro:  Strength and sensation are intact. Psych: Normal affect.  Accessory Clinical Findings    Recent Labs: No results found for requested labs within last 365 days.   Recent Lipid Panel No results found for: "CHOL", "TRIG", "HDL", "CHOLHDL", "VLDL", "LDLCALC", "LDLDIRECT"       ECG personally reviewed by me today-EKG today shows sinus bradycardia with premature supraventricular complexes and LVH 51 bpm- No acute  changes  Echocardiogram 08/26/2021 IMPRESSIONS     1. Left ventricular ejection fraction, by estimation, is 60 to 65%. The  left ventricle has normal function. The left ventricle has no regional  wall motion abnormalities. Left ventricular diastolic parameters are  indeterminate.   2. Right ventricular systolic function is normal. The right ventricular  size is normal.   3. The mitral valve is degenerative. Trivial mitral valve regurgitation.  No evidence of mitral stenosis.   4. The aortic valve is calcified. There is severe calcifcation of the  aortic valve. There is severe thickening of the aortic valve. Aortic valve  regurgitation is trivial. Severe aortic valve stenosis. Aortic valve area,  by VTI measures 0.98 cm. Aortic  valve mean gradient measures 46.0 mmHg. Aortic valve Vmax measures 4.54  m/s.   5. Compared to study dated 09/30/20 AS has worsened with severe AS. the  mean AVG has increased from 40.5 to 72mHg, VMax has increased from  4.044m to 4.5437mand AVA has decreased from 1.24cm2 to 0.9cm2. DVI is  low at 0.22.   FINDINGS   Left Ventricle: Left ventricular ejection fraction, by estimation, is 60  to 65%. The left ventricle has normal function. The left ventricle has no  regional wall motion abnormalities. Global longitudinal strain performed  but not reported based on  interpreter judgement due to suboptimal tracking. The left ventricular  internal cavity size was normal in size. There is no left ventricular  hypertrophy. Left ventricular diastolic parameters are indeterminate.  Normal left ventricular filling pressure.   Right Ventricle: The right ventricular size is normal. No increase in  right ventricular wall thickness. Right ventricular systolic function is  normal.   Left Atrium: Left atrial size was normal in size.   Right Atrium: Right atrial size was normal in size.   Pericardium: There is no evidence of pericardial effusion.   Mitral Valve:  The mitral valve is degenerative in appearance. There is  mild calcification of the mitral valve leaflet(s). Mild to moderate mitral  annular calcification. Trivial mitral valve regurgitation. No evidence of  mitral valve stenosis.   Tricuspid Valve: The tricuspid valve is normal in structure. Tricuspid  valve regurgitation is not demonstrated. No evidence of tricuspid  stenosis.   Aortic Valve: The aortic valve is calcified. There is severe calcifcation  of the aortic valve. There is severe thickening of the aortic valve.  Aortic valve regurgitation is trivial. Severe aortic stenosis is present.  Aortic valve mean gradient measures  46.0 mmHg. Aortic valve peak gradient measures 82.3 mmHg. Aortic valve  area, by VTI measures 0.98 cm.   Pulmonic Valve: The pulmonic valve was normal in structure. Pulmonic valve  regurgitation is not visualized. No evidence of pulmonic stenosis.   Aorta: The aortic root is normal in size and structure.   Venous: The inferior vena cava was not well visualized.   IAS/Shunts: No atrial level shunt detected by color flow Doppler.     CT cardiac scoring 10/29/2020  CLINICAL DATA:  61M for cardiovascular disease risk stratification   EXAM: Coronary Calcium Score   TECHNIQUE: A gated, non-contrast computed tomography scan of the heart was performed using 39m slice thickness. Axial images were analyzed on a dedicated workstation. Calcium scoring of the coronary arteries was performed using the Agatston method.   FINDINGS: Coronary arteries: Normal origins.   Coronary Calcium Score:   Left main: 0   Left anterior descending artery: 112   Left circumflex artery: 10.4   Right coronary artery: 40.9   Total: 163   Percentile: 62nd   Pericardium: Normal.   Ascending Aorta: Normal caliber.  Aortic atherosclerosis.   Calcification of the aortic valve.   [facility]cardiac: [facility]   IMPRESSION: Coronary  calcium score of 163. This was 62nd percentile for age-, race-, and sex-matched controls.   Calcification of the aortic valve and the aorta.   RECOMMENDATIONS: Coronary artery calcium (CAC) score is a strong predictor of incident coronary heart disease (CHD) and provides predictive information beyond traditional risk factors. CAC scoring is reasonable to use in the decision to withhold, postpone, or initiate statin therapy in intermediate-risk or selected borderline-risk asymptomatic adults (age 77-75years and LDL-C >=70 to <190 mg/dL) who do not have diabetes or established atherosclerotic cardiovascular disease (ASCVD).* In intermediate-risk (10-year ASCVD risk >=7.5% to <20%) adults or selected borderline-risk (10-year ASCVD risk >=5% to <7.5%) adults in whom a CAC score is measured for the purpose of making a treatment decision the following recommendations have been made:   If CAC=0, it is reasonable to withhold statin therapy and reassess in 5 to 10 years, as long as higher risk conditions are absent (diabetes mellitus, family history of premature CHD in first degree relatives (males <55 years; females <65 years), cigarette smoking, or LDL >=190 mg/dL).   If CAC is 1 to 99, it is reasonable to initiate statin therapy for patients >=548years of age.   If CAC is >=100 or >=75th percentile, it is reasonable to initiate statin therapy at any age.   Cardiology referral should be considered for patients with CAC scores >=400 or >=75th percentile.   *2018 AHA/ACC/AACVPR/AAPA/ABC/ACPM/ADA/AGS/APhA/ASPC/NLA/PCNA Guideline on the Management of Blood Cholesterol: A Report of the American College of Cardiology/American Heart Association Task Force on Clinical Practice Guidelines. J Am Coll Cardiol. 2019;73(24):3168-3209.   TSkeet Latch MD     Electronically Signed   By: TSkeet LatchM.D.   On: 10/29/2020 17:07  Assessment & Plan   1.  Elevated coronary calcium  score-denies chest pain today.  Denies recent episodes of arm neck back and chest discomfort.  Coronary calcium score noted to be 163 on coronary calcium scoring 10/29/2020. Continue rosuvastatin Increase physical activity as tolerated  Moderate aortic stenosis-no increased work of breathing or activity intolerance.  Echocardiogram 08/26/2021  showed progressing mean valve gradient of 46 mmHg.  Prior echocardiogram showed mean average of 40.5 mmHg.  Plan for biannual echocardiogram was made. Order repeat echocardiogram   Hyperlipidemia-on rosuvastatin. Heart healthy low-sodium high-fiber diet Increase physical activity as tolerated Continue rosuvastatin Follows with PCP Request lab work  Disposition: Follow-up with Dr.Berry in  6 months.   Jossie Ng. Tyri Elmore NP-C     03/13/2022, 3:56 PM [facility] [address] Office [phone] Fax [phone]    I spent 14 minutes examining this patient, reviewing medications, and using patient centered shared decision making involving her cardiac care.  Prior to her visit I spent greater than 20 minutes reviewing her past medical history,  medications, and prior cardiac tests.

## 2022-03-13 NOTE — Patient Instructions (Signed)
Medication Instructions:   Your physician recommends that you continue on your current medications as directed. Please refer to the Current Medication list given to you today.  *If you need a refill on your cardiac medications before your next appointment, please call your pharmacy*  Lab Work: NONE ordered at this time of appointment   If you have labs (blood work) drawn today and your tests are completely normal, you will receive your results only by: [place] (if you have MyChart) OR A paper copy in the mail If you have any lab test that is abnormal or we need to change your treatment, we will call you to review the results.  Testing/Procedures: Your physician has requested that you have an echocardiogram. Echocardiography is a painless test that uses sound waves to create images of your heart. It provides your doctor with information about the size and shape of your heart and how well your heart's chambers and valves are working. This procedure takes approximately one hour. There are no restrictions for this procedure. Please do NOT wear cologne, perfume, aftershave, or lotions (deodorant is allowed). Please arrive 15 minutes prior to your appointment time.  Please schedule for August 2024  Follow-Up: At [facility], you and your health needs are our priority.  As part of our continuing mission to provide you with exceptional heart care, we have created designated Provider Care Teams.  These Care Teams include your primary Cardiologist (physician) and Advanced Practice Providers (APPs -  Physician Assistants and Nurse Practitioners) who all work together to provide you with the care you need, when you need it.  We recommend signing up for the patient portal called "MyChart".  Sign up information is provided on this After Visit Summary.  MyChart is used to connect with patients for Virtual Visits (Telemedicine).  Patients are able to view lab/test results, encounter notes,  upcoming appointments, etc.  Non-urgent messages can be sent to your provider as well.   To learn more about what you can do with MyChart, go to [email or web address].    Your next appointment:   Follow up after Echocardiogram    Provider:   Quay Burow, MD     Other Instructions Increase hydration to daily diet no more that 64-68 fluid ounces. Fluid includes all drinks, coffee, juice, ice chips, soup, jello, and all other liquids.

## 2022-03-17 NOTE — Addendum Note (Signed)
Addended by: Brantley Persons A on: 03/17/2022 12:44 PM   Modules accepted: Orders

## 2022-03-20 ENCOUNTER — Ambulatory Visit: Payer: BC Managed Care – PPO | Admitting: General Practice

## 2022-04-07 DIAGNOSIS — M47816 Spondylosis without myelopathy or radiculopathy, lumbar region: Secondary | ICD-10-CM | POA: Diagnosis not present

## 2022-04-10 DIAGNOSIS — M25562 Pain in left knee: Secondary | ICD-10-CM | POA: Diagnosis not present

## 2022-04-10 DIAGNOSIS — M25512 Pain in left shoulder: Secondary | ICD-10-CM | POA: Diagnosis not present

## 2022-04-10 DIAGNOSIS — M25561 Pain in right knee: Secondary | ICD-10-CM | POA: Diagnosis not present

## 2022-04-20 ENCOUNTER — Inpatient Hospital Stay: Payer: BC Managed Care – PPO

## 2022-04-20 ENCOUNTER — Inpatient Hospital Stay: Payer: BC Managed Care – PPO | Admitting: Nurse Practitioner

## 2022-04-24 DIAGNOSIS — M47816 Spondylosis without myelopathy or radiculopathy, lumbar region: Secondary | ICD-10-CM | POA: Diagnosis not present

## 2022-05-08 DIAGNOSIS — H401113 Primary open-angle glaucoma, right eye, severe stage: Secondary | ICD-10-CM | POA: Diagnosis not present

## 2022-05-14 ENCOUNTER — Other Ambulatory Visit: Payer: Self-pay

## 2022-05-15 ENCOUNTER — Other Ambulatory Visit: Payer: BC Managed Care – PPO

## 2022-05-15 ENCOUNTER — Other Ambulatory Visit: Payer: Self-pay

## 2022-05-15 ENCOUNTER — Encounter: Payer: BC Managed Care – PPO | Admitting: Hematology

## 2022-05-15 DIAGNOSIS — I350 Nonrheumatic aortic (valve) stenosis: Secondary | ICD-10-CM

## 2022-05-15 DIAGNOSIS — E782 Mixed hyperlipidemia: Secondary | ICD-10-CM

## 2022-06-30 DIAGNOSIS — M47816 Spondylosis without myelopathy or radiculopathy, lumbar region: Secondary | ICD-10-CM | POA: Diagnosis not present

## 2022-07-02 DIAGNOSIS — M25562 Pain in left knee: Secondary | ICD-10-CM | POA: Diagnosis not present

## 2022-07-02 DIAGNOSIS — M25561 Pain in right knee: Secondary | ICD-10-CM | POA: Diagnosis not present

## 2022-07-27 DIAGNOSIS — Z23 Encounter for immunization: Secondary | ICD-10-CM | POA: Diagnosis not present

## 2022-07-27 DIAGNOSIS — I350 Nonrheumatic aortic (valve) stenosis: Secondary | ICD-10-CM | POA: Diagnosis not present

## 2022-07-27 DIAGNOSIS — Z79899 Other long term (current) drug therapy: Secondary | ICD-10-CM | POA: Diagnosis not present

## 2022-07-27 DIAGNOSIS — D696 Thrombocytopenia, unspecified: Secondary | ICD-10-CM | POA: Diagnosis not present

## 2022-07-27 DIAGNOSIS — Z0001 Encounter for general adult medical examination with abnormal findings: Secondary | ICD-10-CM | POA: Diagnosis not present

## 2022-07-27 DIAGNOSIS — E119 Type 2 diabetes mellitus without complications: Secondary | ICD-10-CM | POA: Diagnosis not present

## 2022-07-27 DIAGNOSIS — E7800 Pure hypercholesterolemia, unspecified: Secondary | ICD-10-CM | POA: Diagnosis not present

## 2022-08-11 ENCOUNTER — Telehealth: Payer: Self-pay

## 2022-08-11 DIAGNOSIS — M25562 Pain in left knee: Secondary | ICD-10-CM | POA: Diagnosis not present

## 2022-08-11 DIAGNOSIS — M25561 Pain in right knee: Secondary | ICD-10-CM | POA: Diagnosis not present

## 2022-08-12 NOTE — Telephone Encounter (Signed)
   Name: Allen Sanchez  DOB: [birth year]  MRN: [id number]  Primary Cardiologist: Nanetta Batty, MD   Preoperative team, please contact this patient and set up a phone call appointment for further preoperative risk assessment. Please obtain consent and complete medication review. Thank you for your help.  I confirm that guidance regarding antiplatelet and oral anticoagulation therapy has been completed and, if necessary, noted below.  Not on anticoagulation    Joni Reining, NP 08/12/2022, 8:13 AM [facility]

## 2022-08-12 NOTE — Telephone Encounter (Signed)
Pts last OV was 03/23/22 with Edd Fabian, NP.     Pre-operative Risk Assessment    Patient Name: Allen Sanchez  DOB: 10-11-1945 MRN: [id number]      Request for Surgical Clearance    Procedure:   Rt Total Knee Arthroplasty  Date of Surgery:  Clearance TBD                                 Surgeon:  Weber Cooks, MD Surgeon's Group or Practice Name:  [facility] Phone number:  [phone] [facility] Fax number:  [phone]   Type of Clearance Requested:   - Medical    Type of Anesthesia:  Spinal   Additional requests/questions:    SignedZada Finders   08/12/2022, 7:18 AM

## 2022-08-12 NOTE — Telephone Encounter (Signed)
Patient has appointment to see Dr. Allyson Sabal on 08/28/22. Preop clearance added to appointment notes

## 2022-08-14 ENCOUNTER — Ambulatory Visit (HOSPITAL_COMMUNITY): Payer: BC Managed Care – PPO

## 2022-08-19 ENCOUNTER — Encounter (HOSPITAL_COMMUNITY): Payer: Self-pay | Admitting: Emergency Medicine

## 2022-08-27 DIAGNOSIS — I350 Nonrheumatic aortic (valve) stenosis: Secondary | ICD-10-CM | POA: Diagnosis not present

## 2022-08-27 DIAGNOSIS — M1711 Unilateral primary osteoarthritis, right knee: Secondary | ICD-10-CM | POA: Diagnosis not present

## 2022-08-27 DIAGNOSIS — Z01818 Encounter for other preprocedural examination: Secondary | ICD-10-CM | POA: Diagnosis not present

## 2022-08-28 ENCOUNTER — Ambulatory Visit: Payer: BC Managed Care – PPO | Admitting: Cardiovascular Disease

## 2022-08-28 ENCOUNTER — Other Ambulatory Visit: Payer: Self-pay | Admitting: Family Medicine

## 2022-08-28 ENCOUNTER — Ambulatory Visit
Admission: RE | Admit: 2022-08-28 | Discharge: 2022-08-28 | Disposition: A | Discharge status: Home / Self Care | Payer: BC Managed Care – PPO | Source: Ambulatory Visit | Attending: Family Medicine | Admitting: Family Medicine

## 2022-08-28 DIAGNOSIS — Z01818 Encounter for other preprocedural examination: Secondary | ICD-10-CM

## 2022-09-08 DIAGNOSIS — D72819 Decreased white blood cell count, unspecified: Secondary | ICD-10-CM | POA: Diagnosis not present

## 2022-09-11 ENCOUNTER — Ambulatory Visit (HOSPITAL_COMMUNITY): Payer: BC Managed Care – PPO | Attending: Cardiovascular Disease

## 2022-09-11 DIAGNOSIS — I350 Nonrheumatic aortic (valve) stenosis: Secondary | ICD-10-CM | POA: Insufficient documentation

## 2022-09-11 DIAGNOSIS — R931 Abnormal findings on diagnostic imaging of heart and coronary circulation: Secondary | ICD-10-CM | POA: Diagnosis not present

## 2022-09-11 DIAGNOSIS — E782 Mixed hyperlipidemia: Secondary | ICD-10-CM | POA: Insufficient documentation

## 2022-09-15 LAB — ECHOCARDIOGRAM COMPLETE
AR max vel: 1.1 cm2
AV Area VTI: 1.04 cm2
AV Area mean vel: 1.09 cm2
AV Mean grad: 41.2 mmHg
AV Peak grad: 72.2 mmHg
Ao pk vel: 4.25 m/s
Area-P 1/2: 3.6 cm2
S' Lateral: 3 cm

## 2022-09-25 DIAGNOSIS — H401113 Primary open-angle glaucoma, right eye, severe stage: Secondary | ICD-10-CM | POA: Diagnosis not present

## 2022-10-02 ENCOUNTER — Encounter: Payer: Self-pay | Admitting: Nurse Practitioner

## 2022-10-02 ENCOUNTER — Ambulatory Visit: Payer: BC Managed Care – PPO | Attending: Cardiovascular Disease | Admitting: Nurse Practitioner

## 2022-10-02 VITALS — BP 138/84 | HR 64 | Ht 63.0 in | Wt 178.0 lb

## 2022-10-02 DIAGNOSIS — I493 Ventricular premature depolarization: Secondary | ICD-10-CM

## 2022-10-02 DIAGNOSIS — I491 Atrial premature depolarization: Secondary | ICD-10-CM | POA: Diagnosis not present

## 2022-10-02 DIAGNOSIS — Z01810 Encounter for preprocedural cardiovascular examination: Secondary | ICD-10-CM

## 2022-10-02 DIAGNOSIS — I350 Nonrheumatic aortic (valve) stenosis: Secondary | ICD-10-CM | POA: Diagnosis not present

## 2022-10-02 DIAGNOSIS — E782 Mixed hyperlipidemia: Secondary | ICD-10-CM

## 2022-10-02 DIAGNOSIS — R931 Abnormal findings on diagnostic imaging of heart and coronary circulation: Secondary | ICD-10-CM

## 2022-10-02 DIAGNOSIS — E119 Type 2 diabetes mellitus without complications: Secondary | ICD-10-CM

## 2022-10-02 NOTE — Patient Instructions (Addendum)
Medication Instructions:  Your physician recommends that you continue on your current medications as directed. Please refer to the Current Medication list given to you today.  *If you need a refill on your cardiac medications before your next appointment, please call your pharmacy*   Lab Work: NONE ordered at this time of appointment     Testing/Procedures: Your physician has requested that you have an echocardiogram in 6 months. Echocardiography is a painless test that uses sound waves to create images of your heart. It provides your doctor with information about the size and shape of your heart and how well your heart's chambers and valves are working. This procedure takes approximately one hour. There are no restrictions for this procedure. Please do NOT wear cologne, perfume, aftershave, or lotions (deodorant is allowed). Please arrive 15 minutes prior to your appointment time.    Follow-Up: At [facility], you and your health needs are our priority.  As part of our continuing mission to provide you with exceptional heart care, we have created designated Provider Care Teams.  These Care Teams include your primary Cardiologist (physician) and Advanced Practice Providers (APPs -  Physician Assistants and Nurse Practitioners) who all work together to provide you with the care you need, when you need it.  We recommend signing up for the patient portal called "MyChart".  Sign up information is provided on this After Visit Summary.  MyChart is used to connect with patients for Virtual Visits (Telemedicine).  Patients are able to view lab/test results, encounter notes, upcoming appointments, etc.  Non-urgent messages can be sent to your provider as well.   To learn more about what you can do with MyChart, go to [email or web address].    Your next appointment:   6 month(s) post echocardiogram   Provider:   Dr. Allyson Sabal

## 2022-10-02 NOTE — Progress Notes (Signed)
Office Visit    Patient Name: Beckett Maden Hada Date of Encounter: 10/02/2022  Primary Care Provider:  Darrow Bussing, MD Primary Cardiologist:  Nanetta Batty, MD  Chief Complaint    77 year old male with a history of elevated coronary artery calcium score, aortic stenosis, PVCs, hyperlipidemia, type 2 diabetes, and GERD who presents for follow-up related to aortic stenosis and for preoperative cardiac evaluation.  Past Medical History    Past Medical History:  Diagnosis Date   Aortic valve calcification    mild   Arthritis    knee   Diabetes mellitus without complication (HCC)    Eczema    ED (erectile dysfunction)    GERD (gastroesophageal reflux disease)    Horseshoe kidney    Hyperlipidemia    Neuromuscular disorder (HCC)    Sciatic nerve pain greater left -under physical therapy now   Past Surgical History:  Procedure Laterality Date   COLONOSCOPY WITH PROPOFOL N/A 05/29/2014   Procedure: COLONOSCOPY WITH PROPOFOL;  Surgeon: Charolett Bumpers, MD;  Location: [facility];  Service: Endoscopy;  Laterality: N/A;    Allergies  Allergies  Allergen Reactions   Crestor [Rosuvastatin] Other (See Comments)    muscle weakness    Lipitor [Atorvastatin] Other (See Comments)    muscle weakness    Pravastatin Other (See Comments)    myalgias   Zetia [Ezetimibe] Other (See Comments)    muscle weakness      Labs/Other Studies Reviewed    The following studies were reviewed today:  Cardiac Studies & Procedures       ECHOCARDIOGRAM  ECHOCARDIOGRAM COMPLETE 09/11/2022  Narrative ECHOCARDIOGRAM REPORT    Patient Name:   Arnez L Richart Date of Exam: 09/11/2022 Medical Rec #:  [id number]       Height:       63.0 in Accession #:    [id number]      Weight:       179.0 lb Date of Birth:  06/08/1945       BSA:          1.845 m Patient Age:    77 years        BP:           120/68 mmHg Patient Gender: M               HR:           63 bpm. Exam Location:  [address]  Procedure: 2D Echo, Cardiac Doppler, Color Doppler and 3D Echo  Indications:    Aortic stenosis I35.0  History:        Patient has prior history of Echocardiogram examinations, most recent 08/26/2021. Risk Factors:Diabetes and Dyslipidemia.  Sonographer:    Thurman Coyer RDCS Referring Phys: [phone] JONATHAN J BERRY  IMPRESSIONS   1. Left ventricular ejection fraction, by estimation, is 55 to 60%. Left ventricular ejection fraction by 3D volume is 55 %. The left ventricle has normal function. The left ventricle has no regional wall motion abnormalities. Left ventricular diastolic parameters are consistent with Grade II diastolic dysfunction (pseudonormalization). Elevated left ventricular end-diastolic pressure. 2. Right ventricular systolic function is moderately reduced. The right ventricular size is normal. There is normal pulmonary artery systolic pressure. The estimated right ventricular systolic pressure is 33.5 mmHg. 3. Left atrial size was mildly dilated. 4. Right atrial size was mildly dilated. 5. The mitral valve is degenerative. Trivial mitral valve regurgitation. No evidence of mitral stenosis. 6. The aortic valve is tricuspid. There is  severe calcifcation of the aortic valve. There is severe thickening of the aortic valve. Aortic valve regurgitation is not visualized. Severe aortic valve stenosis. Aortic valve area, by VTI measures 1.04 cm. Aortic valve mean gradient measures 41.2 mmHg. Aortic valve Vmax measures 4.25 m/s. 7. The inferior vena cava is normal in size with greater than 50% respiratory variability, suggesting right atrial pressure of 3 mmHg.  FINDINGS Left Ventricle: Left ventricular ejection fraction, by estimation, is 55 to 60%. Left ventricular ejection fraction by 3D volume is 55 %. The left ventricle has normal function. The left ventricle has no regional wall motion abnormalities. The left ventricular internal cavity size was normal in size. There  is no left ventricular hypertrophy. Left ventricular diastolic parameters are consistent with Grade II diastolic dysfunction (pseudonormalization). Elevated left ventricular end-diastolic pressure.  Right Ventricle: The right ventricular size is normal. No increase in right ventricular wall thickness. Right ventricular systolic function is moderately reduced. There is normal pulmonary artery systolic pressure. The tricuspid regurgitant velocity is 2.76 m/s, and with an assumed right atrial pressure of 3 mmHg, the estimated right ventricular systolic pressure is 33.5 mmHg.  Left Atrium: Left atrial size was mildly dilated.  Right Atrium: Right atrial size was mildly dilated.  Pericardium: Trivial pericardial effusion is present. The pericardial effusion is circumferential.  Mitral Valve: The mitral valve is degenerative in appearance. There is mild calcification of the mitral valve leaflet(s). Mild mitral annular calcification. Trivial mitral valve regurgitation. No evidence of mitral valve stenosis.  Tricuspid Valve: The tricuspid valve is normal in structure. Tricuspid valve regurgitation is mild . No evidence of tricuspid stenosis.  Aortic Valve: The aortic valve is tricuspid. There is severe calcifcation of the aortic valve. There is severe thickening of the aortic valve. Aortic valve regurgitation is not visualized. Severe aortic stenosis is present. Aortic valve mean gradient measures 41.2 mmHg. Aortic valve peak gradient measures 72.2 mmHg. Aortic valve area, by VTI measures 1.04 cm.  Pulmonic Valve: The pulmonic valve was normal in structure. Pulmonic valve regurgitation is mild. No evidence of pulmonic stenosis.  Aorta: The aortic root is normal in size and structure.  Venous: The inferior vena cava is normal in size with greater than 50% respiratory variability, suggesting right atrial pressure of 3 mmHg.  IAS/Shunts: No atrial level shunt detected by color flow Doppler.   LEFT  VENTRICLE PLAX 2D LVIDd:         5.10 cm         Diastology LVIDs:         3.00 cm         LV e' medial:    4.60 cm/s LV PW:         1.00 cm         LV E/e' medial:  21.2 LV IVS:        0.80 cm         LV e' lateral:   6.20 cm/s LVOT diam:     2.40 cm         LV E/e' lateral: 15.7 LV SV:         100 LV SV Index:   54 LVOT Area:     4.52 cm        3D Volume EF LV 3D EF:    Left ventricul ar ejection fraction by 3D volume is 55 %.  3D Volume EF: 3D EF:        55 % LV EDV:  133 ml LV ESV:       59 ml LV SV:        73 ml  RIGHT VENTRICLE            IVC RV Basal diam:  5.00 cm    IVC diam: 1.40 cm RV Mid diam:    4.20 cm RV S prime:     9.95 cm/s TAPSE (M-mode): 2.3 cm  LEFT ATRIUM             Index        RIGHT ATRIUM           Index LA diam:        3.40 cm 1.84 cm/m   RA Area:     21.10 cm LA Vol (A2C):   63.3 ml 34.32 ml/m  RA Volume:   65.80 ml  35.67 ml/m LA Vol (A4C):   68.5 ml 37.14 ml/m LA Biplane Vol: 66.2 ml 35.89 ml/m AORTIC VALVE AV Area (Vmax):    1.10 cm AV Area (Vmean):   1.09 cm AV Area (VTI):     1.04 cm AV Vmax:           424.80 cm/s AV Vmean:          298.000 cm/s AV VTI:            0.967 m AV Peak Grad:      72.2 mmHg AV Mean Grad:      41.2 mmHg LVOT Vmax:         103.00 cm/s LVOT Vmean:        71.800 cm/s LVOT VTI:          0.222 m LVOT/AV VTI ratio: 0.23  AORTA Ao Root diam: 3.30 cm Ao Asc diam:  3.70 cm  MITRAL VALVE                TRICUSPID VALVE MV Area (PHT): 3.60 cm     TR Peak grad:   30.5 mmHg MV Decel Time: 211 msec     TR Vmax:        276.00 cm/s MV E velocity: 97.50 cm/s MV A velocity: 105.00 cm/s  SHUNTS MV E/A ratio:  0.93         Systemic VTI:  0.22 m Systemic Diam: 2.40 cm  Armanda Magic MD Electronically signed by Armanda Magic MD Signature Date/Time: 09/15/2022/2:16:14 PM    Final     CT SCANS  CT CARDIAC SCORING (SELF PAY ONLY) 10/29/2020  Addendum 10/29/2020  5:09 PM ADDENDUM REPORT: 10/29/2020  17:07  CLINICAL DATA:  61M for cardiovascular disease risk stratification  EXAM: Coronary Calcium Score  TECHNIQUE: A gated, non-contrast computed tomography scan of the heart was performed using 3mm slice thickness. Axial images were analyzed on a dedicated workstation. Calcium scoring of the coronary arteries was performed using the Agatston method.  FINDINGS: Coronary arteries: Normal origins.  Coronary Calcium Score:  Left main: 0  Left anterior descending artery: 112  Left circumflex artery: 10.4  Right coronary artery: 40.9  Total: 163  Percentile: 62nd  Pericardium: Normal.  Ascending Aorta: Normal caliber.  Aortic atherosclerosis.  Calcification of the aortic valve.  Non-cardiac: See separate report from [facility].  IMPRESSION: Coronary calcium score of 163. This was 62nd percentile for age-, race-, and sex-matched controls.  Calcification of the aortic valve and the aorta.  RECOMMENDATIONS: Coronary artery calcium (CAC) score is a strong predictor of incident coronary heart disease (CHD) and provides predictive information  beyond traditional risk factors. CAC scoring is reasonable to use in the decision to withhold, postpone, or initiate statin therapy in intermediate-risk or selected borderline-risk asymptomatic adults (age 63-75 years and LDL-C >=70 to <190 mg/dL) who do not have diabetes or established atherosclerotic cardiovascular disease (ASCVD).* In intermediate-risk (10-year ASCVD risk >=7.5% to <20%) adults or selected borderline-risk (10-year ASCVD risk >=5% to <7.5%) adults in whom a CAC score is measured for the purpose of making a treatment decision the following recommendations have been made:  If CAC=0, it is reasonable to withhold statin therapy and reassess in 5 to 10 years, as long as higher risk conditions are absent (diabetes mellitus, family history of premature CHD in first degree relatives (males <55 years;  females <65 years), cigarette smoking, or LDL >=190 mg/dL).  If CAC is 1 to 99, it is reasonable to initiate statin therapy for patients >=28 years of age.  If CAC is >=100 or >=75th percentile, it is reasonable to initiate statin therapy at any age.  Cardiology referral should be considered for patients with CAC scores >=400 or >=75th percentile.  *2018 AHA/ACC/AACVPR/AAPA/ABC/ACPM/ADA/AGS/APhA/ASPC/NLA/PCNA Guideline on the Management of Blood Cholesterol: A Report of the American College of Cardiology/American Heart Association Task Force on Clinical Practice Guidelines. J Am Coll Cardiol. 2019;73(24):3168-3209.  Chilton Si, MD   Electronically Signed By: Chilton Si M.D. On: 10/29/2020 17:07  Narrative EXAM: OVER-READ INTERPRETATION  CT CHEST  The following report is an over-read performed by radiologist Dr. Irish Lack of [facility] on 10/29/2020. This over-read does not include interpretation of cardiac or coronary anatomy or pathology. The coronary calcium score interpretation by the cardiologist is attached.  COMPARISON:  None.  FINDINGS: Vascular: Atherosclerosis of the visualized thoracic aorta without evidence aneurysmal disease. The aortic valve is heavily calcified.  Mediastinum/Nodes: Visualized mediastinum and hilar regions demonstrate no evidence of lymphadenopathy or masses.  Lungs/Pleura: Visualized lungs show no evidence of pulmonary edema, consolidation, pneumothorax, nodule or pleural fluid.  Upper Abdomen: No acute abnormality.  Musculoskeletal: No chest wall mass or suspicious bone lesions identified.  IMPRESSION: Heavily calcified aortic valve. The patient has had prior echocardiography documenting aortic valve stenosis.  Electronically Signed: By: Irish Lack M.D. On: 10/29/2020 15:51         Recent Labs: No results found for requested labs within last 365 days.  Recent Lipid Panel No results  found for: "CHOL", "TRIG", "HDL", "CHOLHDL", "VLDL", "LDLCALC", "LDLDIRECT"  History of Present Illness    77 year old male with with the above past medical history including elevated coronary artery calcium score, aortic stenosis, PVCs, hyperlipidemia, type 2 diabetes, and GERD.  Echocardiogram in 2019 revealed normal systolic function, moderate aortic stenosis.  Follow-up echocardiogram in 08/2021 revealed normal LV systolic function, progression of aortic stenosis from moderate to moderately severe, mean gradient increased from 40 mmHg to 46 mmHg.  He was asymptomatic.  Coronary calcium score in 10/2020 was 163 with evidence of calcium in all 3 major coronary arteries.  He was last seen in the office on 03/13/2022 and was stable overall from a cardiac standpoint.  Most recent echocardiogram in 09/11/2022 revealed EF 55 to 60%, normal LV function, no RWMA, G2 DD, moderately reduced RV systolic function, severe aortic stenosis, mean gradient 41.2 mmHg.  He presents today for follow-up  accompanied by his wife and for preoperative cardiac evaluation for right total knee arthroplasty with Dr. Cecilie Lowers of [facility].  Since his last visit he has done well from a cardiac  standpoint.  He denies any symptoms concerning for angina. He denies palpitations, dizziness, presyncope, syncope, dyspnea, edema, PND, orthopnea, weight gain.  Home Medications    Current Outpatient Medications  Medication Sig Dispense Refill   acetaminophen (TYLENOL) 650 MG CR tablet Take 650 mg by mouth every 8 (eight) hours as needed for pain.     brimonidine (ALPHAGAN) 0.2 % ophthalmic solution      CIALIS 20 MG tablet   5   diclofenac (VOLTAREN) 75 MG EC tablet Take 75 mg by mouth 2 (two) times daily.     guaiFENesin (ROBITUSSIN) 100 MG/5ML liquid Take 10 mLs by mouth every 4 (four) hours as needed. 60 mL 0   hydrocortisone 2.5 % cream Apply 1 Application topically 2 (two) times daily.     ketoconazole (NIZORAL) 2  % cream      latanoprost (XALATAN) 0.005 % ophthalmic solution SMARTSIG:1 Drop(s) In Eye(s) Every Evening     meloxicam (MOBIC) 7.5 MG tablet   3   Multiple Vitamin (MULTI VITAMIN DAILY) TABS Take by mouth.     Omega-3 Fatty Acids (FISH OIL) 1200 MG CAPS 1 tablet Orally Once a day     rosuvastatin (CRESTOR) 40 MG tablet Take 1 tablet (40 mg total) by mouth daily. 90 tablet 3   VYZULTA 0.024 % SOLN SMARTSIG:1 Drop(s) In Eye(s) Every Evening     cetirizine (ZYRTEC ALLERGY) 10 MG tablet Take 0.5 tablets (5 mg total) by mouth daily. (Patient not taking: Reported on 10/02/2022) 30 tablet 2   No current facility-administered medications for this visit.     Review of Systems    He denies chest pain, palpitations, dyspnea, pnd, orthopnea, n, v, dizziness, syncope, edema, weight gain, or early satiety. All other systems reviewed and are otherwise negative except as noted above.   Physical Exam    VS:  BP 138/84   Pulse 64   Ht 5\' 3"  (1.6 m)   Wt 178 lb (80.7 kg)   SpO2 100%   BMI 31.53 kg/m   GEN: Well nourished, well developed, in no acute distress. HEENT: normal. Neck: Supple, no JVD, carotid bruits, or masses. Cardiac: RRR with audible ectopy, 3/6 murmurs, no rubs, or gallops. No clubbing, cyanosis, edema.  Radials/DP/PT 2+ and equal bilaterally.  Respiratory:  Respirations regular and unlabored, clear to auscultation bilaterally. GI: Soft, nontender, nondistended, BS + x 4. MS: no deformity or atrophy. Skin: warm and dry, no rash. Neuro:  Strength and sensation are intact. Psych: Normal affect.  Accessory Clinical Findings    ECG personally reviewed by me today - EKG Interpretation Date/Time:  Friday October 02 2022 15:06:56 EDT Ventricular Rate:  64 PR Interval:  162 QRS Duration:  74 QT Interval:  410 QTC Calculation: 422 R Axis:   5  Text Interpretation: Sinus rhythm Premature atrial complexes Minimal voltage criteria for LVH, may be normal variant ( R in aVL ) Confirmed  by Bernadene Person ([id number]) on 10/02/2022 3:09:59 PM  - no acute changes.   Lab Results  Component Value Date   WBC 2.7 (L) 02/06/2017   HGB 14.7 02/06/2017   HCT 44.5 02/06/2017   MCV 88.1 02/06/2017   PLT 120 (L) 02/06/2017   Lab Results  Component Value Date   CREATININE 0.95 02/06/2017   BUN 14 02/06/2017   NA 140 02/06/2017   K 4.2 02/06/2017   CL 107 02/06/2017   CO2 24 02/06/2017   Lab Results  Component Value Date   ALT 21  02/06/2017   AST 24 02/06/2017   ALKPHOS 59 02/06/2017   BILITOT 0.7 02/06/2017   No results found for: "CHOL", "HDL", "LDLCALC", "LDLDIRECT", "TRIG", "CHOLHDL"  No results found for: "HGBA1C"  Assessment & Plan    1. Elevated coronary artery calcium score: Coronary calcium score in 10/2020 was 163 with evidence of calcium in all 3 major coronary arteries. Stable with no anginal symptoms. No indication for ischemic evaluation.  Continue Crestor.  2. Aortic stenosis: Most recent echocardiogram in 09/11/2022 revealed EF 55 to 60%, normal LV function, no RWMA, G2 DD, moderately reduced RV systolic function, severe aortic stenosis, mean gradient 41.2 mmHg (mean and peak gradients slightly improved compared to prior echo). Asymptomatic. Euvolemic and well compensated on exam. He will likely require surgery in the future.  Will plan for repeat echo in 6 months for ongoing monitoring.   3. PVCs/PACs: Asymptomatic. Stable.   4. Hyperlipidemia: LDL was 92 in 07/2022.  His insurance previously denied prior authorization for Repatha.  I will reach out to our pharmacy team to see if this can be reevaluated as he would benefit from PCSK9 inhibitor given LDL is above goal and he is intolerant to ezetimibe. Continue Crestor.  5. Type 2 diabetes: A1c was 5.9 in 07/2022.  Monitored and managed per PCP.  6. Preoperative cardiac evaluation:  According to the Revised Cardiac Risk Index (RCRI), his Perioperative Risk of Major Cardiac Event is (%): 0.4. His Functional Capacity in  METs is: 5.72 according to the Duke Activity Status Index (DASI). Therefore, based on ACC/AHA guidelines, patient would be at acceptable risk for the planned procedure without further cardiovascular testing. I will route this recommendation to the requesting party via Epic fax function.  7. Disposition: Follow-up in 6 months after echo, sooner if needed.      Joylene Grapes, NP 10/02/2022, 3:39 PM

## 2022-10-29 DIAGNOSIS — M25562 Pain in left knee: Secondary | ICD-10-CM | POA: Diagnosis not present

## 2022-10-29 DIAGNOSIS — M25561 Pain in right knee: Secondary | ICD-10-CM | POA: Diagnosis not present

## 2022-11-02 ENCOUNTER — Other Ambulatory Visit: Payer: Self-pay | Admitting: Cardiovascular Disease

## 2022-12-01 DIAGNOSIS — M25511 Pain in right shoulder: Secondary | ICD-10-CM | POA: Diagnosis not present

## 2022-12-18 DIAGNOSIS — M545 Low back pain, unspecified: Secondary | ICD-10-CM | POA: Diagnosis not present

## 2023-01-05 DIAGNOSIS — M47816 Spondylosis without myelopathy or radiculopathy, lumbar region: Secondary | ICD-10-CM | POA: Diagnosis not present

## 2023-01-28 DIAGNOSIS — E7800 Pure hypercholesterolemia, unspecified: Secondary | ICD-10-CM | POA: Diagnosis not present

## 2023-01-28 DIAGNOSIS — R7303 Prediabetes: Secondary | ICD-10-CM | POA: Diagnosis not present

## 2023-01-28 DIAGNOSIS — D696 Thrombocytopenia, unspecified: Secondary | ICD-10-CM | POA: Diagnosis not present

## 2023-01-28 DIAGNOSIS — I350 Nonrheumatic aortic (valve) stenosis: Secondary | ICD-10-CM | POA: Diagnosis not present

## 2023-01-28 DIAGNOSIS — Z79899 Other long term (current) drug therapy: Secondary | ICD-10-CM | POA: Diagnosis not present

## 2023-02-02 DIAGNOSIS — H2513 Age-related nuclear cataract, bilateral: Secondary | ICD-10-CM | POA: Diagnosis not present

## 2023-02-02 DIAGNOSIS — H401113 Primary open-angle glaucoma, right eye, severe stage: Secondary | ICD-10-CM | POA: Diagnosis not present

## 2023-02-16 DIAGNOSIS — H25811 Combined forms of age-related cataract, right eye: Secondary | ICD-10-CM | POA: Diagnosis not present

## 2023-02-16 DIAGNOSIS — H401113 Primary open-angle glaucoma, right eye, severe stage: Secondary | ICD-10-CM | POA: Diagnosis not present

## 2023-02-16 DIAGNOSIS — H25812 Combined forms of age-related cataract, left eye: Secondary | ICD-10-CM | POA: Diagnosis not present

## 2023-02-16 DIAGNOSIS — H4052X0 Glaucoma secondary to other eye disorders, left eye, stage unspecified: Secondary | ICD-10-CM | POA: Diagnosis not present

## 2023-03-09 ENCOUNTER — Telehealth: Payer: Self-pay | Admitting: *Deleted

## 2023-03-09 DIAGNOSIS — M25561 Pain in right knee: Secondary | ICD-10-CM | POA: Diagnosis not present

## 2023-03-09 DIAGNOSIS — M25562 Pain in left knee: Secondary | ICD-10-CM | POA: Diagnosis not present

## 2023-03-09 NOTE — Telephone Encounter (Signed)
   Name: Allen Sanchez  DOB: 16-Dec-1945  MRN: [id number]  Primary Cardiologist: Nanetta Batty, MD  Chart reviewed as part of pre-operative protocol coverage. The patient has an upcoming visit scheduled with Dr. Allyson Sabal on 03/12/2023 at which time clearance can be addressed in case there are any issues that would impact surgical recommendations.  RIGHT TOTAL KNEE ARTHROPLASTY is not scheduled until TBD as below. I added preop FYI to appointment note so that provider is aware to address at time of outpatient visit.  Per office protocol the cardiology provider should forward their finalized clearance decision and recommendations regarding antiplatelet therapy to the requesting party below.    I will route this message as FYI to requesting party and remove this message from the preop box as separate preop APP input not needed at this time.   Please call with any questions.  Joylene Grapes, NP  03/09/2023, 3:01 PM

## 2023-03-09 NOTE — Telephone Encounter (Signed)
   Pre-operative Risk Assessment    Patient Name: Allen Sanchez  DOB: November 19, 1945 MRN: [id number]   Date of last office visit: 10/02/22 Bernadene Person, NP Date of next office visit: 03/12/23 DR. BERRY   Request for Surgical Clearance    Procedure:   RIGHT TOTAL KNEE ARTHROPLASTY  Date of Surgery:  Clearance TBD                                Surgeon:  DR. DANIEL MARCHWIANY Surgeon's Group or Practice Name:  [facility] Phone number:  [phone] EXT [phone] Fax number:  [phone]   Type of Clearance Requested:   - Medical ; NONE INDICATED ON FORM TO BE HELD   Type of Anesthesia:  Spinal   Additional requests/questions:    Elpidio Anis   03/09/2023, 1:57 PM

## 2023-03-10 ENCOUNTER — Ambulatory Visit (HOSPITAL_COMMUNITY): Payer: BC Managed Care – PPO | Attending: Cardiology

## 2023-03-10 DIAGNOSIS — I350 Nonrheumatic aortic (valve) stenosis: Secondary | ICD-10-CM | POA: Diagnosis not present

## 2023-03-10 LAB — ECHOCARDIOGRAM COMPLETE
AR max vel: 0.86 cm2
AV Area VTI: 0.85 cm2
AV Area mean vel: 0.89 cm2
AV Mean grad: 34 mmHg
AV Peak grad: 60.2 mmHg
Ao pk vel: 3.88 m/s
S' Lateral: 2.7 cm

## 2023-03-12 ENCOUNTER — Ambulatory Visit: Payer: BC Managed Care – PPO | Attending: Cardiovascular Disease | Admitting: Cardiovascular Disease

## 2023-03-12 ENCOUNTER — Encounter: Payer: Self-pay | Admitting: Cardiovascular Disease

## 2023-03-12 VITALS — BP 160/90 | HR 51 | Ht 63.0 in | Wt 173.0 lb

## 2023-03-12 DIAGNOSIS — I350 Nonrheumatic aortic (valve) stenosis: Secondary | ICD-10-CM

## 2023-03-12 DIAGNOSIS — I491 Atrial premature depolarization: Secondary | ICD-10-CM

## 2023-03-12 DIAGNOSIS — E782 Mixed hyperlipidemia: Secondary | ICD-10-CM

## 2023-03-12 DIAGNOSIS — Z01818 Encounter for other preprocedural examination: Secondary | ICD-10-CM | POA: Diagnosis not present

## 2023-03-12 DIAGNOSIS — R931 Abnormal findings on diagnostic imaging of heart and coronary circulation: Secondary | ICD-10-CM | POA: Diagnosis not present

## 2023-03-12 NOTE — Assessment & Plan Note (Signed)
 History of aortic stenosis which is progressed from moderate to severe by recent 2D echo performed 03/10/2023.  He had normal LV systolic function with an aortic valve area 0.85 cm and a peak gradient of 60 which has increased from 46 when last checked.  He remains asymptomatic.  Will continue to follow him noninvasively.

## 2023-03-12 NOTE — Patient Instructions (Addendum)
 Medication Instructions:  Your physician recommends that you continue on your current medications as directed. Please refer to the Current Medication list given to you today.  *If you need a refill on your cardiac medications before your next appointment, please call your pharmacy*   Testing/Procedures: Your physician has requested that you have an echocardiogram. Echocardiography is a painless test that uses sound waves to create images of your heart. It provides your doctor with information about the size and shape of your heart and how well your heart's chambers and valves are working. This procedure takes approximately one hour. There are no restrictions for this procedure. Please do NOT wear cologne, perfume, aftershave, or lotions (deodorant is allowed). Please arrive 15 minutes prior to your appointment time. **To do in March 2026**  Please note: We ask at that you not bring children with you during ultrasound (echo/ vascular) testing. Due to room size and safety concerns, children are not allowed in the ultrasound rooms during exams. Our front office staff cannot provide observation of children in our lobby area while testing is being conducted. An adult accompanying a patient to their appointment will only be allowed in the ultrasound room at the discretion of the ultrasound technician under special circumstances. We apologize for any inconvenience.    Follow-Up: At [facility], you and your health needs are our priority.  As part of our continuing mission to provide you with exceptional heart care, we have created designated Provider Care Teams.  These Care Teams include your primary Cardiologist (physician) and Advanced Practice Providers (APPs -  Physician Assistants and Nurse Practitioners) who all work together to provide you with the care you need, when you need it.  We recommend signing up for the patient portal called "MyChart".  Sign up information is provided on this  After Visit Summary.  MyChart is used to connect with patients for Virtual Visits (Telemedicine).  Patients are able to view lab/test results, encounter notes, upcoming appointments, etc.  Non-urgent messages can be sent to your provider as well.   To learn more about what you can do with MyChart, go to [email or web address].    Your next appointment:   12 month(s)  Provider:   Nanetta Batty, MD     Other Instructions We will schedule you an appointment to discuss cholesterol therapy with PharmD.    1st Floor: - Lobby - Registration  - Pharmacy  - Lab - Cafe  2nd Floor: - PV Lab - Diagnostic Testing (echo, CT, nuclear med)  3rd Floor: - Vacant  4th Floor: - TCTS (cardiothoracic surgery) - AFib Clinic - Structural Heart Clinic - Vascular Surgery  - Vascular Ultrasound  5th Floor: - [facility] Card[facility] (general and EP) - Clinical Pharmacy for coumadin, hypertension, lipid, weight-loss medications, and med management appointments    Valet parking services will be available as well.

## 2023-03-12 NOTE — Assessment & Plan Note (Signed)
 Patient apparently needs a total knee replacement.  He does have severe aortic stenosis which places him at moderately increased perioperative risk.

## 2023-03-12 NOTE — Assessment & Plan Note (Signed)
 History of hyperlipidemia on high-dose rosuvastatin with lipid profile performed 07/27/2022 revealing total cholesterol 164, LDL of 92 and HDL of 60, not at goal for secondary prevention given his elevated coronary calcium score of 163.  I am going to add Zetia 10 mg a day and we will recheck a lipid liver profile in 3 months.  LDL goal less than 70

## 2023-03-12 NOTE — Progress Notes (Signed)
 03/12/2023 Allen Sanchez   1945-03-24  [id number]  Primary Physician Allen Bussing, MD Primary Cardiologist: Allen Gess MD Allen Sanchez, [place], [name]  HPI:  Allen Sanchez is a 78 y.o.  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  I last saw him in the office 10/21/2021.  His only cardiac risk factor is treated hyperlipidemia. He does have GERD. He's never had a heart attack or stroke. He denies chest pain or shortness of breath. A recent 2-D echo performed 03/11/17 revealed normal systolic function with moderate aortic stenosis, aortic valve area 1.03 cm with a peak gradient of 51 mmHg.   Since I saw him in the office a year and a half ago he remained stable.  He denies chest pain or shortness of breath.  He did have a 2D echo performed 03/10/2023 that demonstrated progression of his aortic stenosis now to severe with a valve area of 0.85 cm and a peak gradient of 60 mmHg which had increased from 46 mmHg when last checked.  He apparently needs a total knee replacement in the upcoming future.  His severe aortic stenosis places him at moderately increased perioperative risk.   Current Meds  Medication Sig   acetaminophen (TYLENOL) 650 MG CR tablet Take 650 mg by mouth every 8 (eight) hours as needed for pain.   brimonidine (ALPHAGAN) 0.2 % ophthalmic solution    cetirizine (ZYRTEC ALLERGY) 10 MG tablet Take 0.5 tablets (5 mg total) by mouth daily.   CIALIS 20 MG tablet    diclofenac (VOLTAREN) 75 MG EC tablet Take 75 mg by mouth 2 (two) times daily.   dorzolamide-timolol (COSOPT) 2-0.5 % ophthalmic solution Place 1 drop into both eyes 2 (two) times daily.   guaiFENesin (ROBITUSSIN) 100 MG/5ML liquid Take 10 mLs by mouth every 4 (four) hours as needed.    hydrocortisone 2.5 % cream Apply 1 Application topically 2 (two) times daily.   ketoconazole (NIZORAL) 2 % cream    latanoprost (XALATAN) 0.005 % ophthalmic solution SMARTSIG:1 Drop(s) In Eye(s) Every Evening   meloxicam (MOBIC) 7.5 MG tablet    Multiple Vitamin (MULTI VITAMIN DAILY) TABS Take by mouth.   Omega-3 Fatty Acids (FISH OIL) 1200 MG CAPS 1 tablet Orally Once a day   rosuvastatin (CRESTOR) 40 MG tablet TAKE 1 TABLET(40 MG) BY MOUTH DAILY   VYZULTA 0.024 % SOLN SMARTSIG:1 Drop(s) In Eye(s) Every Evening     Allergies  Allergen Reactions   Crestor [Rosuvastatin] Other (See Comments)    muscle weakness    Lipitor [Atorvastatin] Other (See Comments)    muscle weakness    Pravastatin Other (See Comments)    myalgias   Zetia [Ezetimibe] Other (See Comments)    muscle weakness     Social History   Socioeconomic History   Marital status: Married    Spouse name: Not on file   Number of children: Not on file   Years of education: Not on file   Highest education level: Not on file  Occupational History   Not on file  Tobacco Use   Smoking status: Never   Smokeless tobacco: Never  Substance and Sexual Activity   Alcohol use: Yes    Alcohol/week: 0.0 standard drinks of alcohol    Comment: 2 drinks per  week(Sundays)   Drug use: Not on file   Sexual activity: Not on file  Other Topics Concern   Not on file  Social History Narrative   Not on file   Social Drivers of Health   Financial Resource Strain: Not on file  Food Insecurity: Not on file  Transportation Needs: Not on file  Physical Activity: Not on file  Stress: Not on file  Social Connections: Not on file  Intimate Partner Violence: Not on file     Review of Systems: General: negative for chills, fever, night sweats or weight changes.  Cardiovascular: negative for chest pain, dyspnea on exertion, edema, orthopnea, palpitations, paroxysmal nocturnal dyspnea or shortness of breath Dermatological: negative for  rash Respiratory: negative for cough or wheezing Urologic: negative for hematuria Abdominal: negative for nausea, vomiting, diarrhea, bright red blood per rectum, melena, or hematemesis Neurologic: negative for visual changes, syncope, or dizziness All other systems reviewed and are otherwise negative except as noted above.    Blood pressure (!) 160/90, pulse (!) 51, height 5\' 3"  (1.6 m), weight 173 lb (78.5 kg), SpO2 99%.  General appearance: alert and no distress Neck: no adenopathy, no carotid bruit, no JVD, supple, symmetrical, trachea midline, and thyroid not enlarged, symmetric, no tenderness/mass/nodules Lungs: clear to auscultation bilaterally Heart: Soft outflow tract murmur Extremities: extremities normal, atraumatic, no cyanosis or edema Pulses: 2+ and symmetric Skin: Skin color, texture, turgor normal. No rashes or lesions Neurologic: Grossly normal  EKG EKG Interpretation Date/Time:  Friday March 12 2023 16:14:40 EST Ventricular Rate:  51 PR Interval:  166 QRS Duration:  70 QT Interval:  432 QTC Calculation: 398 R Axis:   7  Text Interpretation: Sinus bradycardia with marked sinus arrhythmia When compared with ECG of 02-Oct-2022 15:06, Premature atrial complexes are no longer Present Confirmed by Nanetta Batty [phone]) on 03/12/2023 4:35:32 PM    ASSESSMENT AND PLAN:   Hyperlipidemia History of hyperlipidemia on high-dose rosuvastatin with lipid profile performed 07/27/2022 revealing total cholesterol 164, LDL of 92 and HDL of 60, not at goal for secondary prevention given his elevated coronary calcium score of 163.  I am going to add Zetia 10 mg a day and we will recheck a lipid liver profile in 3 months.  LDL goal less than 70  Moderate aortic stenosis History of aortic stenosis which is progressed from moderate to severe by recent 2D echo performed 03/10/2023.  He had normal LV systolic function with an aortic valve area 0.85 cm and a peak gradient of 60 which has  increased from 46 when last checked.  He remains asymptomatic.  Will continue to follow him noninvasively.  Elevated coronary artery calcium score Elevated coronary calcium score of 163 with calcium in in all 3 coronary arteries performed 10/29/2020.  He is asymptomatic and not at goal for secondary prevention.  Will add Zetia recheck.  Preoperative clearance Patient apparently needs a total knee replacement.  He does have severe aortic stenosis which places him at moderately increased perioperative risk.     Allen Gess MD FACP,FACC,FAHA, [facility] 03/12/2023 4:45 PM

## 2023-03-12 NOTE — Assessment & Plan Note (Signed)
 Elevated coronary calcium score of 163 with calcium in in all 3 coronary arteries performed 10/29/2020.  He is asymptomatic and not at goal for secondary prevention.  Will add Zetia recheck.

## 2023-03-18 DIAGNOSIS — L300 Nummular dermatitis: Secondary | ICD-10-CM | POA: Diagnosis not present

## 2023-03-25 DIAGNOSIS — M25561 Pain in right knee: Secondary | ICD-10-CM | POA: Diagnosis not present

## 2023-03-29 ENCOUNTER — Telehealth: Payer: Self-pay | Admitting: Cardiovascular Disease

## 2023-03-29 DIAGNOSIS — R7303 Prediabetes: Secondary | ICD-10-CM | POA: Diagnosis not present

## 2023-03-29 DIAGNOSIS — I350 Nonrheumatic aortic (valve) stenosis: Secondary | ICD-10-CM | POA: Diagnosis not present

## 2023-03-29 DIAGNOSIS — Z79899 Other long term (current) drug therapy: Secondary | ICD-10-CM | POA: Diagnosis not present

## 2023-03-29 DIAGNOSIS — D696 Thrombocytopenia, unspecified: Secondary | ICD-10-CM | POA: Diagnosis not present

## 2023-03-29 DIAGNOSIS — E7800 Pure hypercholesterolemia, unspecified: Secondary | ICD-10-CM | POA: Diagnosis not present

## 2023-03-29 NOTE — Telephone Encounter (Signed)
 Attempted to call patient, no answer left message requesting a call back.

## 2023-03-29 NOTE — Telephone Encounter (Signed)
 Pt is requesting a callback regarding him needing most recent visit summary and a document regarding his heart valve. He stated he'd discuss further when nurse calls back but he's willing to come pick them up from the office when ready. Please advise.

## 2023-03-30 NOTE — Telephone Encounter (Signed)
 Patient identification verified by 2 forms. Shade Flood, RN     2nd attempt to contact patient. Phone rang once then went to vm. [id number]

## 2023-03-30 NOTE — Telephone Encounter (Signed)
 Pt returning call, requesting cb

## 2023-03-31 ENCOUNTER — Telehealth: Payer: Self-pay | Admitting: *Deleted

## 2023-03-31 NOTE — Telephone Encounter (Signed)
 Pt is calling to have his [facility] OV note printed out and placed at front desk to pick up.   He also inquired about surgery clearance. I sent a message on that phone note requesting that clearance be faxed over if completed.

## 2023-03-31 NOTE — Telephone Encounter (Signed)
 Called patient left message on personal voice mail to call back.

## 2023-03-31 NOTE — Telephone Encounter (Signed)
 Lvm for pt this has been sent back to Dr. Allyson Sabal to address from OV on 03/12/23. Routed via Epic.

## 2023-03-31 NOTE — Telephone Encounter (Signed)
 Pt called in inquiring about if he was cleared for surgery or not and if that can be faxed over.

## 2023-03-31 NOTE — Telephone Encounter (Signed)
 Called back to address pre op. Stated was faxed through epic to Dr. Allyson Sabal.

## 2023-03-31 NOTE — Telephone Encounter (Signed)
 Called patient left message on personal voice mail Dr.Berry's 03/12/23 after visit summary left at front desk.I will send message to Dr.Berry's RN asking her did Dr.Berry clear him for surgery at that visit.

## 2023-04-01 DIAGNOSIS — M25561 Pain in right knee: Secondary | ICD-10-CM | POA: Diagnosis not present

## 2023-04-01 NOTE — Telephone Encounter (Signed)
 Left detailed message stating that clearance for surgery has been re-faxed over to [facility]. Call back number left for questions or concerns.

## 2023-04-01 NOTE — Telephone Encounter (Signed)
 Faxed pt's office visit note from 03/12/23 to Delbert Harness using EPIC fax feature.

## 2023-04-20 DIAGNOSIS — M25561 Pain in right knee: Secondary | ICD-10-CM | POA: Diagnosis not present

## 2023-04-20 NOTE — Progress Notes (Signed)
 Surgery orders requested via Epic inbox.

## 2023-04-26 ENCOUNTER — Ambulatory Visit: Payer: Self-pay | Admitting: Emergency Medicine

## 2023-04-26 DIAGNOSIS — G8929 Other chronic pain: Secondary | ICD-10-CM

## 2023-04-26 NOTE — Progress Notes (Signed)
 Left message for Loetta Ringer, scheduler at Dr. Deedra Farr office, regarding incorrect side on posting sheet.  Per Dr. Deedra Farr PA, Evalyn Hillier, the site should be right knee.  Amanda Jungling to contact me if further questions.

## 2023-04-26 NOTE — H&P (View-Only) (Signed)
 TOTAL KNEE ADMISSION H&P  Patient is being admitted for right total knee arthroplasty.  Subjective:  Chief Complaint:right knee pain.  HPI: Allen Sanchez, 78 y.o. male, has a history of pain and functional disability in the right knee due to arthritis and has failed non-surgical conservative treatments for greater than 12 weeks to includeNSAID's and/or analgesics, corticosteriod injections, use of assistive devices, and activity modification.  Onset of symptoms was gradual, starting 1 years ago with gradually worsening course since that time. The patient noted no past surgery on the right knee(s).  Patient currently rates pain in the right knee(s) at 8 out of 10 with activity. Patient has night pain, worsening of pain with activity and weight bearing, pain that interferes with activities of daily living, and pain with passive range of motion.  Patient has evidence of periarticular osteophytes and joint space narrowing by imaging studies.   There is no active infection.  Patient Active Problem List   Diagnosis Date Noted   Preoperative clearance 03/12/2023   Elevated coronary artery calcium  score 10/21/2021   Moderate aortic stenosis 04/16/2017   Murmur, cardiac 11/13/2013   Overweight 11/13/2013   PAC (premature atrial contraction) 11/13/2013   Hyperlipidemia 11/13/2013   Past Medical History:  Diagnosis Date   Aortic valve calcification    mild   Arthritis    knee   Diabetes mellitus without complication (HCC)    Eczema    ED (erectile dysfunction)    GERD (gastroesophageal reflux disease)    Horseshoe kidney    Hyperlipidemia    Neuromuscular disorder (HCC)    Sciatic nerve pain greater left -under physical therapy now    Past Surgical History:  Procedure Laterality Date   COLONOSCOPY WITH PROPOFOL  N/A 05/29/2014   Procedure: COLONOSCOPY WITH PROPOFOL ;  Surgeon: Garrett Kallman, MD;  Location: [facility];  Service: Endoscopy;  Laterality: N/A;    Current Outpatient  Medications  Medication Sig Dispense Refill Last Dose/Taking   acetaminophen (TYLENOL) 650 MG CR tablet Take 650-1,300 mg by mouth every 8 (eight) hours as needed for pain.      brimonidine (ALPHAGAN) 0.2 % ophthalmic solution Place 1 drop into both eyes in the morning and at bedtime.      cetirizine  (ZYRTEC  ALLERGY) 10 MG tablet Take 0.5 tablets (5 mg total) by mouth daily. (Patient not taking: Reported on 04/22/2023) 30 tablet 2    CIALIS 20 MG tablet Take 20 mg by mouth daily as needed for erectile dysfunction.  5    diclofenac (VOLTAREN) 75 MG EC tablet Take 75 mg by mouth 2 (two) times daily.      dorzolamide-timolol (COSOPT) 2-0.5 % ophthalmic solution Place 1 drop into both eyes 2 (two) times daily.      guaiFENesin  (ROBITUSSIN) 100 MG/5ML liquid Take 10 mLs by mouth every 4 (four) hours as needed. (Patient not taking: Reported on 04/22/2023) 60 mL 0    hydrocortisone 2.5 % cream Apply 1 Application topically daily as needed (eczema).      latanoprost (XALATAN) 0.005 % ophthalmic solution Place 1 drop into both eyes at bedtime.      Multiple Vitamin (MULTI VITAMIN DAILY) TABS Take 1 tablet by mouth daily.      Omega-3 Fatty Acids (FISH OIL) 1200 MG CAPS Take 1,200 mg by mouth daily.      rosuvastatin  (CRESTOR ) 40 MG tablet TAKE 1 TABLET(40 MG) BY MOUTH DAILY 90 tablet 3    triamcinolone  cream (KENALOG) 0.1 % Apply 1 Application topically  2 (two) times daily as needed (eczema).      No current facility-administered medications for this visit.   Allergies  Allergen Reactions   Crestor  [Rosuvastatin ] Other (See Comments)    muscle weakness    Lipitor [Atorvastatin] Other (See Comments)    muscle weakness    Pravastatin Other (See Comments)    myalgias   Zetia [Ezetimibe] Other (See Comments)    muscle weakness     Social History   Tobacco Use   Smoking status: Never   Smokeless tobacco: Never  Substance Use Topics   Alcohol use: Yes    Alcohol/week: 0.0 standard drinks of  alcohol    Comment: 2 drinks per week(Sundays)    Family History  Problem Relation Age of Onset   Aneurysm Mother    Stroke Father      Review of Systems  Musculoskeletal:  Positive for arthralgias.  All other systems reviewed and are negative.   Objective:  Physical Exam Constitutional:      General: He is not in acute distress.    Appearance: Normal appearance. He is normal weight.  HENT:     Head: Normocephalic and atraumatic.  Eyes:     Extraocular Movements: Extraocular movements intact.     Conjunctiva/sclera: Conjunctivae normal.     Pupils: Pupils are equal, round, and reactive to light.  Cardiovascular:     Rate and Rhythm: Normal rate and regular rhythm.     Pulses: Normal pulses.     Heart sounds: Normal heart sounds.  Pulmonary:     Effort: Pulmonary effort is normal. No respiratory distress.     Breath sounds: Normal breath sounds.  Abdominal:     General: Bowel sounds are normal. There is no distension.     Palpations: Abdomen is soft.     Tenderness: There is no abdominal tenderness.  Musculoskeletal:        General: Tenderness present.     Cervical back: Normal range of motion and neck supple.     Comments: TTP over medial and lateral joint line, medial worse than lateral.  No calf tenderness, swelling, or erythema.  No overlying lesions of area of chief complaint.  Decreased strength and ROM due to elicited pain.  Pre-operative ROM 5-95.  Dorsiflexion and plantarflexion intact.  Stable to varus and valgus stress.  BLE appear grossly neurovascularly intact.  Gait mildly antalgic.   Lymphadenopathy:     Cervical: No cervical adenopathy.  Skin:    General: Skin is warm and dry.     Capillary Refill: Capillary refill takes less than 2 seconds.     Findings: No erythema or rash.  Neurological:     General: No focal deficit present.     Mental Status: He is alert and oriented to person, place, and time.  Psychiatric:        Mood and Affect: Mood normal.         Behavior: Behavior normal.     Vital signs in last 24 hours: @VSRANGES @  Labs:   Estimated body mass index is 30.65 kg/m as calculated from the following:   Height as of 03/12/23: 5\' 3"  (1.6 m).   Weight as of 03/12/23: 78.5 kg.   Imaging Review Plain radiographs demonstrate severe degenerative joint disease of the right knee(s). The overall alignment issignificant varus. The bone quality appears to be fair for age and reported activity level.      Assessment/Plan:  End stage arthritis, right knee   The patient  history, physical examination, clinical judgment of the provider and imaging studies are consistent with end stage degenerative joint disease of the right knee(s) and total knee arthroplasty is deemed medically necessary. The treatment options including medical management, injection therapy arthroscopy and arthroplasty were discussed at length. The risks and benefits of total knee arthroplasty were presented and reviewed. The risks due to aseptic loosening, infection, stiffness, patella tracking problems, thromboembolic complications and other imponderables were discussed. The patient acknowledged the explanation, agreed to proceed with the plan and consent was signed. Patient is being admitted for inpatient treatment for surgery, pain control, PT, OT, prophylactic antibiotics, VTE prophylaxis, progressive ambulation and ADL's and discharge planning. The patient is planning to be discharged home with outpatient PT.    Anticipated LOS equal to or greater than 2 midnights due to - Age 21 and older with one or more of the following:  - Obesity  - Expected need for hospital services (PT, OT, Nursing) required for safe  discharge  - Anticipated need for postoperative skilled nursing care or inpatient rehab  - Active co-morbidities: aortic stenosis, pre-diabetes, thrombocytopenia, HLD, GERD, eczema, glaucoma, geriatric, horseshoe kidney OR   - Unanticipated findings  during/Post Surgery: None  - Patient is a high risk of re-admission due to: None

## 2023-04-26 NOTE — Progress Notes (Signed)
 COVID Vaccine Completed:  Date of COVID positive in last 90 days:  PCP - Dibas Koirala, MD Cardiologist - Lauro Portal, MD LOV 03/12/23   Cardiac clearance by Dr. Katheryne Pane 03/12/23 in Epic   Chest x-ray - 08/28/22 Epic EKG - 03/12/23 Epic Stress Test -  ECHO - 03/10/23 Epic Cardiac Cath -  Pacemaker/ICD device last checked: Spinal Cord Stimulator:  Bowel Prep -   Sleep Study -  CPAP -   Fasting Blood Sugar -  Checks Blood Sugar _____ times a day  Last dose of GLP1 agonist-  N/A GLP1 instructions:  Hold 7 days before surgery    Last dose of SGLT-2 inhibitors-  N/A SGLT-2 instructions:  Hold 3 days before surgery    Blood Thinner Instructions:  Last dose:   Time: Aspirin Instructions: Last Dose:  Activity level:  Can go up a flight of stairs and perform activities of daily living without stopping and without symptoms of chest pain or shortness of breath.  Able to exercise without symptoms  Unable to go up a flight of stairs without symptoms of     Anesthesia review:   Patient denies shortness of breath, fever, cough and chest pain at PAT appointment  Patient verbalized understanding of instructions that were given to them at the PAT appointment. Patient was also instructed that they will need to review over the PAT instructions again at home before surgery.

## 2023-04-26 NOTE — H&P (Signed)
 TOTAL KNEE ADMISSION H&P  Patient is being admitted for right total knee arthroplasty.  Subjective:  Chief Complaint:right knee pain.  HPI: Allen Sanchez, 78 y.o. male, has a history of pain and functional disability in the right knee due to arthritis and has failed non-surgical conservative treatments for greater than 12 weeks to includeNSAID's and/or analgesics, corticosteriod injections, use of assistive devices, and activity modification.  Onset of symptoms was gradual, starting 1 years ago with gradually worsening course since that time. The patient noted no past surgery on the right knee(s).  Patient currently rates pain in the right knee(s) at 8 out of 10 with activity. Patient has night pain, worsening of pain with activity and weight bearing, pain that interferes with activities of daily living, and pain with passive range of motion.  Patient has evidence of periarticular osteophytes and joint space narrowing by imaging studies.   There is no active infection.  Patient Active Problem List   Diagnosis Date Noted   Preoperative clearance 03/12/2023   Elevated coronary artery calcium  score 10/21/2021   Moderate aortic stenosis 04/16/2017   Murmur, cardiac 11/13/2013   Overweight 11/13/2013   PAC (premature atrial contraction) 11/13/2013   Hyperlipidemia 11/13/2013   Past Medical History:  Diagnosis Date   Aortic valve calcification    mild   Arthritis    knee   Diabetes mellitus without complication (HCC)    Eczema    ED (erectile dysfunction)    GERD (gastroesophageal reflux disease)    Horseshoe kidney    Hyperlipidemia    Neuromuscular disorder (HCC)    Sciatic nerve pain greater left -under physical therapy now    Past Surgical History:  Procedure Laterality Date   COLONOSCOPY WITH PROPOFOL  N/A 05/29/2014   Procedure: COLONOSCOPY WITH PROPOFOL ;  Surgeon: Garrett Kallman, MD;  Location: [facility];  Service: Endoscopy;  Laterality: N/A;    Current Outpatient  Medications  Medication Sig Dispense Refill Last Dose/Taking   acetaminophen (TYLENOL) 650 MG CR tablet Take 650-1,300 mg by mouth every 8 (eight) hours as needed for pain.      brimonidine (ALPHAGAN) 0.2 % ophthalmic solution Place 1 drop into both eyes in the morning and at bedtime.      cetirizine  (ZYRTEC  ALLERGY) 10 MG tablet Take 0.5 tablets (5 mg total) by mouth daily. (Patient not taking: Reported on 04/22/2023) 30 tablet 2    CIALIS 20 MG tablet Take 20 mg by mouth daily as needed for erectile dysfunction.  5    diclofenac (VOLTAREN) 75 MG EC tablet Take 75 mg by mouth 2 (two) times daily.      dorzolamide-timolol (COSOPT) 2-0.5 % ophthalmic solution Place 1 drop into both eyes 2 (two) times daily.      guaiFENesin  (ROBITUSSIN) 100 MG/5ML liquid Take 10 mLs by mouth every 4 (four) hours as needed. (Patient not taking: Reported on 04/22/2023) 60 mL 0    hydrocortisone 2.5 % cream Apply 1 Application topically daily as needed (eczema).      latanoprost (XALATAN) 0.005 % ophthalmic solution Place 1 drop into both eyes at bedtime.      Multiple Vitamin (MULTI VITAMIN DAILY) TABS Take 1 tablet by mouth daily.      Omega-3 Fatty Acids (FISH OIL) 1200 MG CAPS Take 1,200 mg by mouth daily.      rosuvastatin  (CRESTOR ) 40 MG tablet TAKE 1 TABLET(40 MG) BY MOUTH DAILY 90 tablet 3    triamcinolone  cream (KENALOG) 0.1 % Apply 1 Application topically  2 (two) times daily as needed (eczema).      No current facility-administered medications for this visit.   Allergies  Allergen Reactions   Crestor  [Rosuvastatin ] Other (See Comments)    muscle weakness    Lipitor [Atorvastatin] Other (See Comments)    muscle weakness    Pravastatin Other (See Comments)    myalgias   Zetia [Ezetimibe] Other (See Comments)    muscle weakness     Social History   Tobacco Use   Smoking status: Never   Smokeless tobacco: Never  Substance Use Topics   Alcohol use: Yes    Alcohol/week: 0.0 standard drinks of  alcohol    Comment: 2 drinks per week(Sundays)    Family History  Problem Relation Age of Onset   Aneurysm Mother    Stroke Father      Review of Systems  Musculoskeletal:  Positive for arthralgias.  All other systems reviewed and are negative.   Objective:  Physical Exam Constitutional:      General: He is not in acute distress.    Appearance: Normal appearance. He is normal weight.  HENT:     Head: Normocephalic and atraumatic.  Eyes:     Extraocular Movements: Extraocular movements intact.     Conjunctiva/sclera: Conjunctivae normal.     Pupils: Pupils are equal, round, and reactive to light.  Cardiovascular:     Rate and Rhythm: Normal rate and regular rhythm.     Pulses: Normal pulses.     Heart sounds: Normal heart sounds.  Pulmonary:     Effort: Pulmonary effort is normal. No respiratory distress.     Breath sounds: Normal breath sounds.  Abdominal:     General: Bowel sounds are normal. There is no distension.     Palpations: Abdomen is soft.     Tenderness: There is no abdominal tenderness.  Musculoskeletal:        General: Tenderness present.     Cervical back: Normal range of motion and neck supple.     Comments: TTP over medial and lateral joint line, medial worse than lateral.  No calf tenderness, swelling, or erythema.  No overlying lesions of area of chief complaint.  Decreased strength and ROM due to elicited pain.  Pre-operative ROM 5-95.  Dorsiflexion and plantarflexion intact.  Stable to varus and valgus stress.  BLE appear grossly neurovascularly intact.  Gait mildly antalgic.   Lymphadenopathy:     Cervical: No cervical adenopathy.  Skin:    General: Skin is warm and dry.     Capillary Refill: Capillary refill takes less than 2 seconds.     Findings: No erythema or rash.  Neurological:     General: No focal deficit present.     Mental Status: He is alert and oriented to person, place, and time.  Psychiatric:        Mood and Affect: Mood normal.         Behavior: Behavior normal.     Vital signs in last 24 hours: @VSRANGES @  Labs:   Estimated body mass index is 30.65 kg/m as calculated from the following:   Height as of 03/12/23: 5\' 3"  (1.6 m).   Weight as of 03/12/23: 78.5 kg.   Imaging Review Plain radiographs demonstrate severe degenerative joint disease of the right knee(s). The overall alignment issignificant varus. The bone quality appears to be fair for age and reported activity level.      Assessment/Plan:  End stage arthritis, right knee   The patient  history, physical examination, clinical judgment of the provider and imaging studies are consistent with end stage degenerative joint disease of the right knee(s) and total knee arthroplasty is deemed medically necessary. The treatment options including medical management, injection therapy arthroscopy and arthroplasty were discussed at length. The risks and benefits of total knee arthroplasty were presented and reviewed. The risks due to aseptic loosening, infection, stiffness, patella tracking problems, thromboembolic complications and other imponderables were discussed. The patient acknowledged the explanation, agreed to proceed with the plan and consent was signed. Patient is being admitted for inpatient treatment for surgery, pain control, PT, OT, prophylactic antibiotics, VTE prophylaxis, progressive ambulation and ADL's and discharge planning. The patient is planning to be discharged home with outpatient PT.    Anticipated LOS equal to or greater than 2 midnights due to - Age 48 and older with one or more of the following:  - Obesity  - Expected need for hospital services (PT, OT, Nursing) required for safe  discharge  - Anticipated need for postoperative skilled nursing care or inpatient rehab  - Active co-morbidities: aortic stenosis, pre-diabetes, thrombocytopenia, HLD, GERD, eczema, glaucoma, geriatric, horseshoe kidney OR   - Unanticipated findings  during/Post Surgery: None  - Patient is a high risk of re-admission due to: None

## 2023-04-26 NOTE — Patient Instructions (Signed)
 SURGICAL WAITING ROOM VISITATION  Patients having surgery or a procedure may have no more than 2 support people in the waiting area - these visitors may rotate.    Children under the age of 49 must have an adult with them who is not the patient.  Due to an increase in RSV and influenza rates and associated hospitalizations, children ages 4 and under may not visit patients in [facility] hospitals.  Visitors with respiratory illnesses are discouraged from visiting and should remain at home.  If the patient needs to stay at the hospital during part of their recovery, the visitor guidelines for inpatient rooms apply. Pre-op nurse will coordinate an appropriate time for 1 support person to accompany patient in pre-op.  This support person may not rotate.    Please refer to the [facility] website for the visitor guidelines for Inpatients (after your surgery is over and you are in a regular room).    Your procedure is scheduled on: 05/10/23   Report to [facility] Main Entrance    Report to admitting at 5:15 AM   Call this number if you have problems the morning of surgery [phone]   Do not eat food :After Midnight.   After Midnight you may have the following liquids until 4:30 AM DAY OF SURGERY  Water Non-Citrus Juices (without pulp, NO RED-Apple, White grape, White cranberry) Black Coffee (NO MILK/CREAM OR CREAMERS, sugar ok)  Clear Tea (NO MILK/CREAM OR CREAMERS, sugar ok) regular and decaf                             Plain Jell-O (NO RED)                                           Fruit ices (not with fruit pulp, NO RED)                                     Popsicles (NO RED)                                                               Sports drinks like Gatorade (NO RED)                 The day of surgery:  Drink ONE (1) Pre-Surgery G2 at 4:30 AM the morning of surgery. Drink in one sitting. Do not sip.  This drink was given to you during your hospital  pre-op  appointment visit. Nothing else to drink after completing the  Pre-Surgery G2.          If you have questions, please contact your surgeon's office.   FOLLOW BOWEL PREP AND ANY ADDITIONAL PRE OP INSTRUCTIONS YOU RECEIVED FROM YOUR SURGEON'S OFFICE!!!     Oral Hygiene is also important to reduce your risk of infection.                                    Remember - BRUSH YOUR  TEETH THE MORNING OF SURGERY WITH YOUR REGULAR TOOTHPASTE  DENTURES WILL BE REMOVED PRIOR TO SURGERY PLEASE DO NOT APPLY "Poly grip" OR ADHESIVES!!!   Stop all vitamins and herbal supplements 7 days before surgery.   Take these medicines the morning of surgery with A SIP OF WATER: Tylenol, Eye drops, Rosuvastatin    DO NOT TAKE ANY ORAL DIABETIC MEDICATIONS DAY OF YOUR SURGERY  How to Manage Your Diabetes Before and After Surgery  Why is it important to control my blood sugar before and after surgery? Improving blood sugar levels before and after surgery helps healing and can limit problems. A way of improving blood sugar control is eating a healthy diet by:  Eating less sugar and carbohydrates  Increasing activity/exercise  Talking with your doctor about reaching your blood sugar goals High blood sugars (greater than 180 mg/dL) can raise your risk of infections and slow your recovery, so you will need to focus on controlling your diabetes during the weeks before surgery. Make sure that the doctor who takes care of your diabetes knows about your planned surgery including the date and location.  How do I manage my blood sugar before surgery? Check your blood sugar at least 4 times a day, starting 2 days before surgery, to make sure that the level is not too high or low. Check your blood sugar the morning of your surgery when you wake up and every 2 hours until you get to the Short Stay unit. If your blood sugar is less than 70 mg/dL, you will need to treat for low blood sugar: Do not take insulin. Treat a low  blood sugar (less than 70 mg/dL) with  cup of clear juice (cranberry or apple), 4 glucose tablets, OR glucose gel. Recheck blood sugar in 15 minutes after treatment (to make sure it is greater than 70 mg/dL). If your blood sugar is not greater than 70 mg/dL on recheck, call [phone] for further instructions. Report your blood sugar to the short stay nurse when you get to Short Stay.  If you are admitted to the hospital after surgery: Your blood sugar will be checked by the staff and you will probably be given insulin after surgery (instead of oral diabetes medicines) to make sure you have good blood sugar levels. The goal for blood sugar control after surgery is 80-180 mg/dL.  Reviewed and Endorsed by [facility] Patient Education Committee, August 2015                              You may not have any metal on your body including jewelry, and body piercing             Do not wear lotions, powders, cologne, or deodorant              Men may shave face and neck.   Do not bring valuables to the hospital. [place] IS NOT             RESPONSIBLE   FOR VALUABLES.   Contacts, glasses, dentures or bridgework may not be worn into surgery.   Bring small overnight bag day of surgery.   DO NOT BRING YOUR HOME MEDICATIONS TO THE HOSPITAL. PHARMACY WILL DISPENSE MEDICATIONS LISTED ON YOUR MEDICATION LIST TO YOU DURING YOUR ADMISSION IN THE HOSPITAL!    Special Instructions: Bring a copy of your healthcare power of attorney and living will documents the day of surgery if  you haven't scanned them before.              Please read over the following fact sheets you were given: IF YOU HAVE QUESTIONS ABOUT YOUR PRE-OP INSTRUCTIONS PLEASE CALL [phone] Party   If you received a COVID test during your pre-op visit  it is requested that you wear a mask when out in public, stay away from anyone that may not be feeling well and notify your surgeon if you develop symptoms. If you test positive  for Covid or have been in contact with anyone that has tested positive in the last 10 days please notify you surgeon.    Pre-operative 5 CHG Bath Instructions   You can play a key role in reducing the risk of infection after surgery. Your skin needs to be as free of germs as possible. You can reduce the number of germs on your skin by washing with CHG (chlorhexidine gluconate) soap before surgery. CHG is an antiseptic soap that kills germs and continues to kill germs even after washing.   DO NOT use if you have an allergy to chlorhexidine/CHG or antibacterial soaps. If your skin becomes reddened or irritated, stop using the CHG and notify one of our RNs at [phone].   Please shower with the CHG soap starting 4 days before surgery using the following schedule:     Please keep in mind the following:  DO NOT shave, including legs and underarms, starting the day of your first shower.   You may shave your face at any point before/day of surgery.  Place clean sheets on your bed the day you start using CHG soap. Use a clean washcloth (not used since being washed) for each shower. DO NOT sleep with pets once you start using the CHG.   CHG Shower Instructions:  If you choose to wash your hair and private area, wash first with your normal shampoo/soap.  After you use shampoo/soap, rinse your hair and body thoroughly to remove shampoo/soap residue.  Turn the water OFF and apply about 3 tablespoons (45 ml) of CHG soap to a CLEAN washcloth.  Apply CHG soap ONLY FROM YOUR NECK DOWN TO YOUR TOES (washing for 3-5 minutes)  DO NOT use CHG soap on face, private areas, open wounds, or sores.  Pay special attention to the area where your surgery is being performed.  If you are having back surgery, having someone wash your back for you may be helpful. Wait 2 minutes after CHG soap is applied, then you may rinse off the CHG soap.  Pat dry with a clean towel  Put on clean clothes/pajamas   If you choose  to wear lotion, please use ONLY the CHG-compatible lotions on the back of this paper.     Additional instructions for the day of surgery: DO NOT APPLY any lotions, deodorants, cologne, or perfumes.   Put on clean/comfortable clothes.  Brush your teeth.  Ask your nurse before applying any prescription medications to the skin.      CHG Compatible Lotions   Aveeno Moisturizing lotion  Cetaphil Moisturizing Cream  Cetaphil Moisturizing Lotion  Clairol Herbal Essence Moisturizing Lotion, Dry Skin  Clairol Herbal Essence Moisturizing Lotion, Extra Dry Skin  Clairol Herbal Essence Moisturizing Lotion, Normal Skin  Curel Age Defying Therapeutic Moisturizing Lotion with Alpha Hydroxy  Curel Extreme Care Body Lotion  Curel Soothing Hands Moisturizing Hand Lotion  Curel Therapeutic Moisturizing Cream, Fragrance-Free  Curel Therapeutic Moisturizing Lotion, Fragrance-Free  Curel Therapeutic Moisturizing Lotion, Original  Formula  Eucerin Daily Replenishing Lotion  Eucerin Dry Skin Therapy Plus Alpha Hydroxy Crme  Eucerin Dry Skin Therapy Plus Alpha Hydroxy Lotion  Eucerin Original Crme  Eucerin Original Lotion  Eucerin Plus Crme Eucerin Plus Lotion  Eucerin TriLipid Replenishing Lotion  Keri Anti-Bacterial Hand Lotion  Keri Deep Conditioning Original Lotion Dry Skin Formula Softly Scented  Keri Deep Conditioning Original Lotion, Fragrance Free Sensitive Skin Formula  Keri Lotion Fast Absorbing Fragrance Free Sensitive Skin Formula  Keri Lotion Fast Absorbing Softly Scented Dry Skin Formula  Keri Original Lotion  Keri Skin Renewal Lotion Keri Silky Smooth Lotion  Keri Silky Smooth Sensitive Skin Lotion  Nivea Body Creamy Conditioning Oil  Nivea Body Extra Enriched Teacher, adult education Moisturizing Lotion Nivea Crme  Nivea Skin Firming Lotion  NutraDerm 30 Skin Lotion  NutraDerm Skin Lotion  NutraDerm Therapeutic Skin Cream  NutraDerm Therapeutic  Skin Lotion  ProShield Protective Hand Cream  Provon moisturizing lotion   View Pre-Surgery Education Videos:  [email or web address]        WHAT IS A BLOOD TRANSFUSION? Blood Transfusion Information  A transfusion is the replacement of blood or some of its parts. Blood is made up of multiple cells which provide different functions. Red blood cells carry oxygen and are used for blood loss replacement. White blood cells fight against infection. Platelets control bleeding. Plasma helps clot blood. Other blood products are available for specialized needs, such as hemophilia or other clotting disorders. BEFORE THE TRANSFUSION  Who gives blood for transfusions?  Healthy volunteers who are fully evaluated to make sure their blood is safe. This is blood bank blood. Transfusion therapy is the safest it has ever been in the practice of medicine. Before blood is taken from a donor, a complete history is taken to make sure that person has no history of diseases nor engages in risky social behavior (examples are intravenous drug use or sexual activity with multiple partners). The donor's travel history is screened to minimize risk of transmitting infections, such as malaria. The donated blood is tested for signs of infectious diseases, such as HIV and hepatitis. The blood is then tested to be sure it is compatible with you in order to minimize the chance of a transfusion reaction. If you or a relative donates blood, this is often done in anticipation of surgery and is not appropriate for emergency situations. It takes many days to process the donated blood. RISKS AND COMPLICATIONS Although transfusion therapy is very safe and saves many lives, the main dangers of transfusion include:  Getting an infectious disease. Developing a transfusion reaction. This is an allergic reaction to something in the blood you were given. Every precaution is taken  to prevent this. The decision to have a blood transfusion has been considered carefully by your caregiver before blood is given. Blood is not given unless the benefits outweigh the risks. AFTER THE TRANSFUSION Right after receiving a blood transfusion, you will usually feel much better and more energetic. This is especially true if your red blood cells have gotten low (anemic). The transfusion raises the level of the red blood cells which carry oxygen, and this usually causes an energy increase. The nurse administering the transfusion will monitor you carefully for complications. HOME CARE INSTRUCTIONS  No special instructions are needed after a transfusion. You may find your energy is better. Speak with your caregiver about any limitations on activity for underlying diseases you may have. SEEK MEDICAL CARE IF:  Your condition is not improving after your transfusion. You develop redness or irritation at the intravenous (IV) site. SEEK IMMEDIATE MEDICAL CARE IF:  Any of the following symptoms occur over the next 12 hours: Shaking chills. You have a temperature by mouth above 102 F (38.9 C), not controlled by medicine. Chest, back, or muscle pain. People around you feel you are not acting correctly or are confused. Shortness of breath or difficulty breathing. Dizziness and fainting. You get a rash or develop hives. You have a decrease in urine output. Your urine turns a dark color or changes to pink, red, or brown. Any of the following symptoms occur over the next 10 days: You have a temperature by mouth above 102 F (38.9 C), not controlled by medicine. Shortness of breath. Weakness after normal activity. The white part of the eye turns yellow (jaundice). You have a decrease in the amount of urine or are urinating less often. Your urine turns a dark color or changes to pink, red, or brown. Document Released: 12/20/1999 Document Revised: 03/16/2011 Document Reviewed:  08/08/2007 ExitCare Patient Information 2014 [place], [place].  _______________________________________________________________________  Incentive Spirometer  An incentive spirometer is a tool that can help keep your lungs clear and active. This tool measures how well you are filling your lungs with each breath. Taking long deep breaths may help reverse or decrease the chance of developing breathing (pulmonary) problems (especially infection) following: A long period of time when you are unable to move or be active. BEFORE THE PROCEDURE  If the spirometer includes an indicator to show your best effort, your nurse or respiratory therapist will set it to a desired goal. If possible, sit up straight or lean slightly forward. Try not to slouch. Hold the incentive spirometer in an upright position. INSTRUCTIONS FOR USE  Sit on the edge of your bed if possible, or sit up as far as you can in bed or on a chair. Hold the incentive spirometer in an upright position. Breathe out normally. Place the mouthpiece in your mouth and seal your lips tightly around it. Breathe in slowly and as deeply as possible, raising the piston or the ball toward the top of the column. Hold your breath for 3-5 seconds or for as long as possible. Allow the piston or ball to fall to the bottom of the column. Remove the mouthpiece from your mouth and breathe out normally. Rest for a few seconds and repeat Steps 1 through 7 at least 10 times every 1-2 hours when you are awake. Take your time and take a few normal breaths between deep breaths. The spirometer may include an indicator to show your best effort. Use the indicator as a goal to work toward during each repetition. After each set of 10 deep breaths, practice coughing to be sure your lungs are clear. If you have an incision (the cut made at the time of surgery), support your incision when coughing by placing a pillow or rolled up towels firmly against it. Once you are  able to get out of bed, walk around indoors and cough well. You may stop using the incentive spirometer when instructed by your caregiver.  RISKS AND COMPLICATIONS Take your time so you do not get dizzy or light-headed. If you are in pain, you may need to take or ask for pain medication before doing incentive spirometry. It is harder to take a deep breath if you are having pain. AFTER USE Rest and breathe slowly and easily. It can be helpful  to keep track of a log of your progress. Your caregiver can provide you with a simple table to help with this. If you are using the spirometer at home, follow these instructions: SEEK MEDICAL CARE IF:  You are having difficultly using the spirometer. You have trouble using the spirometer as often as instructed. Your pain medication is not giving enough relief while using the spirometer. You develop fever of 100.5 F (38.1 C) or higher. SEEK IMMEDIATE MEDICAL CARE IF:  You cough up bloody sputum that had not been present before. You develop fever of 102 F (38.9 C) or greater. You develop worsening pain at or near the incision site. MAKE SURE YOU:  Understand these instructions. Will watch your condition. Will get help right away if you are not doing well or get worse. Document Released: 05/04/2006 Document Revised: 03/16/2011 Document Reviewed: 07/05/2006 [facility] Patient Information 2014 [place], [place].   ________________________________________________________________________

## 2023-04-27 ENCOUNTER — Encounter (HOSPITAL_COMMUNITY)
Admission: RE | Admit: 2023-04-27 | Discharge: 2023-04-27 | Disposition: A | Discharge status: Home / Self Care | Source: Ambulatory Visit | Attending: Orthopedic Surgery | Admitting: Orthopedic Surgery

## 2023-04-27 ENCOUNTER — Other Ambulatory Visit: Payer: Self-pay

## 2023-04-27 ENCOUNTER — Encounter (HOSPITAL_COMMUNITY): Payer: Self-pay

## 2023-04-27 VITALS — BP 116/56 | HR 68 | Temp 97.7°F | Resp 18 | Ht 63.0 in | Wt 171.2 lb

## 2023-04-27 DIAGNOSIS — K219 Gastro-esophageal reflux disease without esophagitis: Secondary | ICD-10-CM | POA: Insufficient documentation

## 2023-04-27 DIAGNOSIS — M25561 Pain in right knee: Secondary | ICD-10-CM | POA: Diagnosis not present

## 2023-04-27 DIAGNOSIS — E119 Type 2 diabetes mellitus without complications: Secondary | ICD-10-CM | POA: Diagnosis not present

## 2023-04-27 DIAGNOSIS — I350 Nonrheumatic aortic (valve) stenosis: Secondary | ICD-10-CM | POA: Insufficient documentation

## 2023-04-27 DIAGNOSIS — G8929 Other chronic pain: Secondary | ICD-10-CM | POA: Insufficient documentation

## 2023-04-27 DIAGNOSIS — Z01812 Encounter for preprocedural laboratory examination: Secondary | ICD-10-CM | POA: Insufficient documentation

## 2023-04-27 DIAGNOSIS — I2510 Atherosclerotic heart disease of native coronary artery without angina pectoris: Secondary | ICD-10-CM | POA: Diagnosis not present

## 2023-04-27 DIAGNOSIS — M1711 Unilateral primary osteoarthritis, right knee: Secondary | ICD-10-CM | POA: Insufficient documentation

## 2023-04-27 DIAGNOSIS — Z79899 Other long term (current) drug therapy: Secondary | ICD-10-CM | POA: Insufficient documentation

## 2023-04-27 DIAGNOSIS — Z01818 Encounter for other preprocedural examination: Secondary | ICD-10-CM | POA: Diagnosis not present

## 2023-04-27 HISTORY — DX: Nonrheumatic aortic (valve) stenosis: I35.0

## 2023-04-27 HISTORY — DX: Atrial premature depolarization: I49.1

## 2023-04-27 LAB — CBC WITH DIFFERENTIAL/PLATELET
Abs Immature Granulocytes: 0.02 10*3/uL (ref 0.00–0.07)
Basophils Absolute: 0 10*3/uL (ref 0.0–0.1)
Basophils Relative: 1 %
Eosinophils Absolute: 0.1 10*3/uL (ref 0.0–0.5)
Eosinophils Relative: 3 %
HCT: 40.8 % (ref 39.0–52.0)
Hemoglobin: 12.9 g/dL — ABNORMAL LOW (ref 13.0–17.0)
Immature Granulocytes: 1 %
Lymphocytes Relative: 23 %
Lymphs Abs: 0.8 10*3/uL (ref 0.7–4.0)
MCH: 28.4 pg (ref 26.0–34.0)
MCHC: 31.6 g/dL (ref 30.0–36.0)
MCV: 89.9 fL (ref 80.0–100.0)
Monocytes Absolute: 0.6 10*3/uL (ref 0.1–1.0)
Monocytes Relative: 15 %
Neutro Abs: 2.2 10*3/uL (ref 1.7–7.7)
Neutrophils Relative %: 57 %
Platelets: 111 10*3/uL — ABNORMAL LOW (ref 150–400)
RBC: 4.54 MIL/uL (ref 4.22–5.81)
RDW: 14.7 % (ref 11.5–15.5)
WBC: 3.7 10*3/uL — ABNORMAL LOW (ref 4.0–10.5)
nRBC: 0 % (ref 0.0–0.2)

## 2023-04-27 LAB — COMPREHENSIVE METABOLIC PANEL WITH GFR
ALT: 23 U/L (ref 0–44)
AST: 23 U/L (ref 15–41)
Albumin: 3.7 g/dL (ref 3.5–5.0)
Alkaline Phosphatase: 68 U/L (ref 38–126)
Anion gap: 8 (ref 5–15)
BUN: 25 mg/dL — ABNORMAL HIGH (ref 8–23)
CO2: 23 mmol/L (ref 22–32)
Calcium: 9.9 mg/dL (ref 8.9–10.3)
Chloride: 110 mmol/L (ref 98–111)
Creatinine, Ser: 0.84 mg/dL (ref 0.61–1.24)
GFR, Estimated: >60 mL/min (ref >60)
Glucose, Bld: 107 mg/dL — ABNORMAL HIGH (ref 70–99)
Potassium: 4.3 mmol/L (ref 3.5–5.1)
Sodium: 141 mmol/L (ref 135–145)
Total Bilirubin: 0.6 mg/dL (ref 0.0–1.2)
Total Protein: 6.7 g/dL (ref 6.5–8.1)

## 2023-04-27 LAB — SURGICAL PCR SCREEN
MRSA, PCR: NEGATIVE
Staphylococcus aureus: NEGATIVE

## 2023-04-27 LAB — HEMOGLOBIN A1C
Hgb A1c MFr Bld: 5.6 % (ref 4.8–5.6)
Mean Plasma Glucose: 114.02 mg/dL

## 2023-04-27 LAB — TYPE AND SCREEN
ABO/RH(D): O POS
Antibody Screen: NEGATIVE

## 2023-04-27 LAB — GLUCOSE, CAPILLARY: Glucose-Capillary: 109 mg/dL — ABNORMAL HIGH (ref 70–99)

## 2023-04-27 NOTE — Progress Notes (Signed)
 Patient scheduled for a LEFT TKA in surgery schedule. Per patient and consent order is supposed to be RIGHT TKA. Called and left voicemail for Loetta Ringer, RN at Dr. Deedra Farr office regarding this concern.

## 2023-04-29 ENCOUNTER — Encounter (HOSPITAL_COMMUNITY): Payer: Self-pay

## 2023-04-29 NOTE — Progress Notes (Addendum)
 Case: [id number] Date/Time: 05/10/23 0715   Procedure: ARTHROPLASTY, KNEE, TOTAL (Right: Knee)   Anesthesia type: Spinal   Pre-op diagnosis: OA RIGHT KNEE   Location: [facility] ROOM 08 / [facility]   Surgeons: Murleen Arms, MD       DISCUSSION: Allen Sanchez ia a 78 yo male who presents to PAT prior to surgery above. PMH of severe aortic stenosis, CAD (by CT), GERD.    Patient seen by Cardiology on 03/12/23 for pre op clearance. He had an echo on 03/10/23 which showed known AS had progressed to severe. He denied any symptoms. He was cleared for surgery:  "History of aortic stenosis which is progressed from moderate to severe by recent 2D echo performed 03/10/2023. He had normal LV systolic function with an aortic valve area 0.85 cm and a peak gradient of 60 which has increased from 46 when last checked. He remains asymptomatic. Will continue to follow him noninvasively.  Preoperative clearance Patient apparently needs a total knee replacement.  He does have severe aortic stenosis which places him at moderately increased perioperative risk."   Pt with hx of Diabetes however A1c is 5.6 below prediabetes range. He does not take any meds for diabetes.  VS: BP (!) 116/56   Pulse 68   Temp 36.5 C (Oral)   Resp 18   Ht 5\' 3"  (1.6 m)   Wt 77.7 kg   SpO2 99%   BMI 30.33 kg/m   PROVIDERS: Koirala, Dibas, MD   LABS: Labs reviewed: Acceptable for surgery. (all labs ordered are listed, but only abnormal results are displayed)  Labs Reviewed  CBC WITH DIFFERENTIAL/PLATELET - Abnormal; Notable for the following components:      Result Value   WBC 3.7 (*)    Hemoglobin 12.9 (*)    Platelets 111 (*)    All other components within normal limits  COMPREHENSIVE METABOLIC PANEL WITH GFR - Abnormal; Notable for the following components:   Glucose, Bld 107 (*)    BUN 25 (*)    All other components within normal limits  GLUCOSE, CAPILLARY - Abnormal; Notable for the following components:    Glucose-Capillary 109 (*)    All other components within normal limits  SURGICAL PCR SCREEN  HEMOGLOBIN A1C  TYPE AND SCREEN     IMAGES:   EKG 03/12/23:  Sinus bradycardia with marked sinus arrhythmia, rate 51 When compared with ECG of 02-Oct-2022 15:06, Premature atrial complexes are no longer Present  CV:  Echo 03/10/23: IMPRESSIONS     1. Left ventricular ejection fraction, by estimation, is 60 to 65%. The  left ventricle has normal function. The left ventricle has no regional  wall motion abnormalities. There is mild left ventricular hypertrophy.  Left ventricular diastolic parameters  are indeterminate.   2. Right ventricular systolic function is normal. The right ventricular  size is normal. There is normal pulmonary artery systolic pressure.   3. The mitral valve is abnormal. Mild mitral valve regurgitation.  Moderate to severe mitral annular calcification.   4. The aortic valve is calcified. Aortic valve regurgitation is mild.  Severe aortic valve stenosis. Aortic valve area, by VTI measures 0.85 cm.  Aortic valve mean gradient measures 34.0 mmHg. Aortic valve Vmax measures  3.88 m/s. Peak gradient 60.2 mmHg,   DI 0.22.   5. The inferior vena cava is normal in size with greater than 50%  respiratory variability, suggesting right atrial pressure of 3 mmHg.   6. Left atrial size was mildly dilated.  Comparison(s): Changes from prior study are noted. 09/11/2022: LVEF 55-60%,  aortic stenosis - MG 42 mmHg.   CT calcium  score 10/29/2020:  IMPRESSION: Coronary calcium  score of 163. This was 62nd percentile for age-, race-, and sex-matched controls.   Calcification of the aortic valve and the aorta.    Past Medical History:  Diagnosis Date   Aortic valve calcification    mild   Arthritis    knee   Diabetes mellitus without complication (HCC)    Eczema    ED (erectile dysfunction)    GERD (gastroesophageal reflux disease)    Horseshoe kidney     Hyperlipidemia    Neuromuscular disorder (HCC)    Sciatic nerve pain greater left -under physical therapy now   PAC (premature atrial contraction)     Past Surgical History:  Procedure Laterality Date   COLONOSCOPY WITH PROPOFOL  N/A 05/29/2014   Procedure: COLONOSCOPY WITH PROPOFOL ;  Surgeon: Garrett Kallman, MD;  Location: [facility];  Service: Endoscopy;  Laterality: N/A;    MEDICATIONS:  acetaminophen (TYLENOL) 650 MG CR tablet   brimonidine (ALPHAGAN) 0.2 % ophthalmic solution   cetirizine  (ZYRTEC  ALLERGY) 10 MG tablet   CIALIS 20 MG tablet   diclofenac (VOLTAREN) 75 MG EC tablet   dorzolamide-timolol (COSOPT) 2-0.5 % ophthalmic solution   guaiFENesin  (ROBITUSSIN) 100 MG/5ML liquid   hydrocortisone 2.5 % cream   latanoprost (XALATAN) 0.005 % ophthalmic solution   Multiple Vitamin (MULTI VITAMIN DAILY) TABS   Omega-3 Fatty Acids (FISH OIL) 1200 MG CAPS   rosuvastatin  (CRESTOR ) 40 MG tablet   triamcinolone  cream (KENALOG) 0.1 %   No current facility-administered medications for this encounter.    Antoinette Kirschner [facility] Short Stay/Anesthesiology [facility] Phone [phone] 04/29/2023 1:51 PM

## 2023-04-29 NOTE — Anesthesia Preprocedure Evaluation (Addendum)
 Anesthesia Evaluation  Patient identified by MRN, date of birth, ID band Patient awake    Reviewed: Allergy & Precautions, NPO status , Patient's Chart, lab work & pertinent test results, reviewed documented beta blocker date and time   History of Anesthesia Complications Negative for: history of anesthetic complications  Airway Mallampati: III  TM Distance: >3 FB     Dental no notable dental hx.    Pulmonary neg COPD, neg PE   breath sounds clear to auscultation       Cardiovascular (-) angina + CAD  (-) Past MI + Valvular Problems/Murmurs (severe AS) AS  Rhythm:Regular Rate:Normal - Systolic murmurs    Neuro/Psych neg Seizures  Neuromuscular disease    GI/Hepatic ,GERD  ,,(+) neg Cirrhosis        Endo/Other  diabetes    Renal/GU Renal disease     Musculoskeletal  (+) Arthritis , Osteoarthritis,    Abdominal   Peds  Hematology   Anesthesia Other Findings   Reproductive/Obstetrics                             Anesthesia Physical Anesthesia Plan  ASA: 4  Anesthesia Plan: General   Post-op Pain Management: Regional block*   Induction: Intravenous  PONV Risk Score and Plan: 2 and Ondansetron and Dexamethasone   Airway Management Planned: LMA  Additional Equipment: ClearSight  Intra-op Plan:   Post-operative Plan: Extubation in OR  Informed Consent: I have reviewed the patients History and Physical, chart, labs and discussed the procedure including the risks, benefits and alternatives for the proposed anesthesia with the patient or authorized representative who has indicated his/her understanding and acceptance.     Dental advisory given  Plan Discussed with: CRNA  Anesthesia Plan Comments: (See PAT note on 4/22)        Anesthesia Quick Evaluation

## 2023-05-04 NOTE — Care Plan (Signed)
 Ortho Bundle Case Management Note  Patient Details  Name: Allen Sanchez MRN: [id number] Date of Birth: 12/18/1945  met with patient in the office for H&P. will discharge to home with family to assist. rolling walker ordered for home. OPPT set up with [facility]. discharge instructions discussed and questions answered. Patient and MD in agreement with plan. Choice offered.                     DME Arranged:  Otho Blitz rolling DME Agency:  [employer]  HH Arranged:  PT HH Agency:  [facility]  Additional Comments: Please contact me with any questions of if this plan should need to change.  Cornelia Dieter,  RN,BSN,MHA,CCM  [facility] Orthopaedic Specialist  [phone] 05/04/2023, 1:48 PM

## 2023-05-06 DIAGNOSIS — M25561 Pain in right knee: Secondary | ICD-10-CM | POA: Diagnosis not present

## 2023-05-10 ENCOUNTER — Ambulatory Visit (HOSPITAL_BASED_OUTPATIENT_CLINIC_OR_DEPARTMENT_OTHER): Admitting: Anesthesiology

## 2023-05-10 ENCOUNTER — Other Ambulatory Visit: Payer: Self-pay

## 2023-05-10 ENCOUNTER — Observation Stay (HOSPITAL_COMMUNITY)

## 2023-05-10 ENCOUNTER — Observation Stay (HOSPITAL_COMMUNITY)
Admission: RE | Admit: 2023-05-10 | Discharge: 2023-05-11 | Disposition: A | Discharge status: Home / Self Care | Attending: Orthopedic Surgery | Admitting: Orthopedic Surgery

## 2023-05-10 ENCOUNTER — Ambulatory Visit (HOSPITAL_COMMUNITY): Payer: Self-pay | Admitting: Medical

## 2023-05-10 ENCOUNTER — Encounter (HOSPITAL_COMMUNITY)
Admission: RE | Disposition: A | Discharge status: Home / Self Care | Payer: Self-pay | Source: Home / Self Care | Attending: Orthopedic Surgery

## 2023-05-10 ENCOUNTER — Encounter (HOSPITAL_COMMUNITY): Payer: Self-pay | Admitting: Orthopedic Surgery

## 2023-05-10 DIAGNOSIS — E119 Type 2 diabetes mellitus without complications: Secondary | ICD-10-CM | POA: Insufficient documentation

## 2023-05-10 DIAGNOSIS — I350 Nonrheumatic aortic (valve) stenosis: Secondary | ICD-10-CM

## 2023-05-10 DIAGNOSIS — Z79899 Other long term (current) drug therapy: Secondary | ICD-10-CM | POA: Insufficient documentation

## 2023-05-10 DIAGNOSIS — Z471 Aftercare following joint replacement surgery: Secondary | ICD-10-CM | POA: Diagnosis not present

## 2023-05-10 DIAGNOSIS — M1711 Unilateral primary osteoarthritis, right knee: Secondary | ICD-10-CM

## 2023-05-10 DIAGNOSIS — I2510 Atherosclerotic heart disease of native coronary artery without angina pectoris: Secondary | ICD-10-CM

## 2023-05-10 DIAGNOSIS — R609 Edema, unspecified: Secondary | ICD-10-CM | POA: Diagnosis not present

## 2023-05-10 DIAGNOSIS — G8918 Other acute postprocedural pain: Secondary | ICD-10-CM | POA: Diagnosis not present

## 2023-05-10 DIAGNOSIS — Z96651 Presence of right artificial knee joint: Secondary | ICD-10-CM | POA: Diagnosis not present

## 2023-05-10 HISTORY — PX: TOTAL KNEE ARTHROPLASTY: SHX125

## 2023-05-10 LAB — ABO/RH: ABO/RH(D): O POS

## 2023-05-10 LAB — GLUCOSE, CAPILLARY
Glucose-Capillary: 108 mg/dL — ABNORMAL HIGH (ref 70–99)
Glucose-Capillary: 108 mg/dL — ABNORMAL HIGH (ref 70–99)
Glucose-Capillary: 121 mg/dL — ABNORMAL HIGH (ref 70–99)
Glucose-Capillary: 186 mg/dL — ABNORMAL HIGH (ref 70–99)

## 2023-05-10 SURGERY — ARTHROPLASTY, KNEE, TOTAL
Anesthesia: General | Site: Knee | Laterality: Right

## 2023-05-10 MED ORDER — ONDANSETRON HCL 4 MG/2ML IJ SOLN: 4.0000 mg | Freq: Once | INTRAMUSCULAR | Status: DC | PRN

## 2023-05-10 MED ORDER — KETAMINE HCL 10 MG/ML IJ SOLN: INTRAMUSCULAR | Status: DC | PRN

## 2023-05-10 MED ORDER — SODIUM CHLORIDE 0.9 % IR SOLN
Status: DC | PRN
  Administered 2023-05-10: 1000 mL

## 2023-05-10 MED ORDER — ACETAMINOPHEN 500 MG PO TABS: 1000.0000 mg | ORAL_TABLET | Freq: Three times a day (TID) | ORAL | Status: AC | PRN

## 2023-05-10 MED ORDER — FENTANYL CITRATE PF 50 MCG/ML IJ SOSY
25.0000 ug | PREFILLED_SYRINGE | INTRAMUSCULAR | Status: DC | PRN
  Administered 2023-05-10 (×3): 50 ug via INTRAVENOUS

## 2023-05-10 MED ORDER — PHENYLEPHRINE 80 MCG/ML (10ML) SYRINGE FOR IV PUSH (FOR BLOOD PRESSURE SUPPORT)
PREFILLED_SYRINGE | INTRAVENOUS | Status: DC | PRN
Start: 2023-05-10 — End: 2023-05-10
  Administered 2023-05-10: 80 ug via INTRAVENOUS
  Administered 2023-05-10: 160 ug via INTRAVENOUS
  Administered 2023-05-10: 240 ug via INTRAVENOUS

## 2023-05-10 MED ORDER — ORAL CARE MOUTH RINSE: 15.0000 mL | Freq: Once | OROMUCOSAL | Status: AC

## 2023-05-10 MED ORDER — DIPHENHYDRAMINE HCL 12.5 MG/5ML PO ELIX: 12.5000 mg | ORAL_SOLUTION | ORAL | Status: DC | PRN

## 2023-05-10 MED ORDER — OXYCODONE HCL 5 MG PO TABS: 5.0000 mg | ORAL_TABLET | Freq: Once | ORAL | Status: DC | PRN

## 2023-05-10 MED ORDER — FENTANYL CITRATE (PF) 100 MCG/2ML IJ SOLN
INTRAMUSCULAR | Status: AC
  Filled 2023-05-10: qty 2

## 2023-05-10 MED ORDER — CELECOXIB 100 MG PO CAPS: 100.0000 mg | ORAL_CAPSULE | Freq: Two times a day (BID) | ORAL | 0 refills | Status: AC

## 2023-05-10 MED ORDER — DEXAMETHASONE SODIUM PHOSPHATE 10 MG/ML IJ SOLN
INTRAMUSCULAR | Status: AC
  Filled 2023-05-10: qty 1

## 2023-05-10 MED ORDER — PROPOFOL 10 MG/ML IV BOLUS
INTRAVENOUS | Status: AC
  Filled 2023-05-10: qty 20

## 2023-05-10 MED ORDER — SODIUM CHLORIDE 0.9 % IV SOLN: INTRAVENOUS | Status: DC

## 2023-05-10 MED ORDER — OMEPRAZOLE 40 MG PO CPDR: 40.0000 mg | DELAYED_RELEASE_CAPSULE | Freq: Every day | ORAL | 0 refills | Status: DC

## 2023-05-10 MED ORDER — BRIMONIDINE TARTRATE 0.2 % OP SOLN
1.0000 [drp] | Freq: Two times a day (BID) | OPHTHALMIC | Status: DC
  Administered 2023-05-10 – 2023-05-11 (×2): 1 [drp] via OPHTHALMIC
  Filled 2023-05-10: qty 5

## 2023-05-10 MED ORDER — PROPOFOL 1000 MG/100ML IV EMUL
INTRAVENOUS | Status: AC
  Filled 2023-05-10: qty 100

## 2023-05-10 MED ORDER — SODIUM CHLORIDE (PF) 0.9 % IJ SOLN
INTRAMUSCULAR | Status: AC
  Filled 2023-05-10: qty 30

## 2023-05-10 MED ORDER — CHLORHEXIDINE GLUCONATE 0.12 % MT SOLN
15.0000 mL | Freq: Once | OROMUCOSAL | Status: AC
  Administered 2023-05-10: 15 mL via OROMUCOSAL

## 2023-05-10 MED ORDER — BUPIVACAINE LIPOSOME 1.3 % IJ SUSP
INTRAMUSCULAR | Status: AC
  Filled 2023-05-10: qty 20

## 2023-05-10 MED ORDER — ACETAMINOPHEN 10 MG/ML IV SOLN: 1000.0000 mg | Freq: Once | INTRAVENOUS | Status: DC | PRN

## 2023-05-10 MED ORDER — ASPIRIN 81 MG PO TBEC
81.0000 mg | DELAYED_RELEASE_TABLET | Freq: Two times a day (BID) | ORAL | Status: AC
Start: 2023-05-11 — End: 2023-06-08

## 2023-05-10 MED ORDER — POLYETHYLENE GLYCOL 3350 17 G PO PACK: 17.0000 g | PACK | Freq: Every day | ORAL | 0 refills | Status: DC

## 2023-05-10 MED ORDER — POLYETHYLENE GLYCOL 3350 17 G PO PACK: 17.0000 g | PACK | Freq: Every day | ORAL | Status: DC | PRN

## 2023-05-10 MED ORDER — PROPOFOL 10 MG/ML IV BOLUS
INTRAVENOUS | Status: DC | PRN
  Administered 2023-05-10: 150 mg via INTRAVENOUS

## 2023-05-10 MED ORDER — HYDROMORPHONE HCL 1 MG/ML IJ SOLN: 0.5000 mg | INTRAMUSCULAR | Status: DC | PRN

## 2023-05-10 MED ORDER — OXYCODONE HCL 5 MG/5ML PO SOLN: 5.0000 mg | Freq: Once | ORAL | Status: DC | PRN

## 2023-05-10 MED ORDER — MENTHOL 3 MG MT LOZG: 1.0000 | LOZENGE | OROMUCOSAL | Status: DC | PRN

## 2023-05-10 MED ORDER — MIDAZOLAM HCL 2 MG/2ML IJ SOLN
INTRAMUSCULAR | Status: AC
Start: 2023-05-10 — End: ?
  Filled 2023-05-10: qty 2

## 2023-05-10 MED ORDER — OXYCODONE HCL 5 MG PO TABS
5.0000 mg | ORAL_TABLET | ORAL | Status: DC | PRN
  Administered 2023-05-10 – 2023-05-11 (×3): 5 mg via ORAL
  Filled 2023-05-10 (×3): qty 1

## 2023-05-10 MED ORDER — CLONIDINE HCL (ANALGESIA) 100 MCG/ML EP SOLN
EPIDURAL | Status: DC | PRN
  Administered 2023-05-10: 100 ug

## 2023-05-10 MED ORDER — ROPIVACAINE HCL 5 MG/ML IJ SOLN
INTRAMUSCULAR | Status: DC | PRN
  Administered 2023-05-10: 30 mL via PERINEURAL

## 2023-05-10 MED ORDER — ONDANSETRON HCL 4 MG/2ML IJ SOLN
INTRAMUSCULAR | Status: DC | PRN
  Administered 2023-05-10: 4 mg via INTRAVENOUS

## 2023-05-10 MED ORDER — METHOCARBAMOL 500 MG PO TABS: 500.0000 mg | ORAL_TABLET | Freq: Three times a day (TID) | ORAL | 0 refills | Status: AC | PRN

## 2023-05-10 MED ORDER — METHOCARBAMOL 500 MG PO TABS
500.0000 mg | ORAL_TABLET | Freq: Four times a day (QID) | ORAL | Status: DC | PRN
  Administered 2023-05-10: 500 mg via ORAL
  Filled 2023-05-10: qty 1

## 2023-05-10 MED ORDER — ONDANSETRON HCL 4 MG PO TABS: 4.0000 mg | ORAL_TABLET | Freq: Three times a day (TID) | ORAL | 0 refills | Status: AC | PRN

## 2023-05-10 MED ORDER — PHENOL 1.4 % MT LIQD: 1.0000 | OROMUCOSAL | Status: DC | PRN

## 2023-05-10 MED ORDER — HYDROCORTISONE 1 % EX CREA: 1.0000 | TOPICAL_CREAM | Freq: Every day | CUTANEOUS | Status: DC | PRN

## 2023-05-10 MED ORDER — CEFAZOLIN SODIUM-DEXTROSE 2-4 GM/100ML-% IV SOLN
2.0000 g | INTRAVENOUS | Status: AC
  Administered 2023-05-10: 2 g via INTRAVENOUS
  Filled 2023-05-10: qty 100

## 2023-05-10 MED ORDER — PANTOPRAZOLE SODIUM 40 MG PO TBEC
40.0000 mg | DELAYED_RELEASE_TABLET | Freq: Every day | ORAL | Status: DC
  Administered 2023-05-10 – 2023-05-11 (×2): 40 mg via ORAL
  Filled 2023-05-10 (×2): qty 1

## 2023-05-10 MED ORDER — FENTANYL CITRATE PF 50 MCG/ML IJ SOSY
PREFILLED_SYRINGE | INTRAMUSCULAR | Status: AC
  Filled 2023-05-10: qty 2

## 2023-05-10 MED ORDER — ACETAMINOPHEN 325 MG PO TABS: 325.0000 mg | ORAL_TABLET | Freq: Four times a day (QID) | ORAL | Status: DC | PRN

## 2023-05-10 MED ORDER — LACTATED RINGERS IV SOLN: INTRAVENOUS | Status: DC

## 2023-05-10 MED ORDER — KETAMINE HCL 50 MG/5ML IJ SOSY
PREFILLED_SYRINGE | INTRAMUSCULAR | Status: DC | PRN
  Administered 2023-05-10 (×2): 20 mg via INTRAVENOUS
  Administered 2023-05-10: 10 mg via INTRAVENOUS

## 2023-05-10 MED ORDER — ACETAMINOPHEN 500 MG PO TABS
1000.0000 mg | ORAL_TABLET | Freq: Four times a day (QID) | ORAL | Status: AC
  Administered 2023-05-10 – 2023-05-11 (×4): 1000 mg via ORAL
  Filled 2023-05-10 (×4): qty 2

## 2023-05-10 MED ORDER — BUPIVACAINE LIPOSOME 1.3 % IJ SUSP: 20.0000 mL | Freq: Once | INTRAMUSCULAR | Status: DC

## 2023-05-10 MED ORDER — HYDROMORPHONE HCL 2 MG/ML IJ SOLN
INTRAMUSCULAR | Status: AC
  Filled 2023-05-10: qty 1

## 2023-05-10 MED ORDER — MIDAZOLAM HCL 2 MG/2ML IJ SOLN
INTRAMUSCULAR | Status: DC | PRN
  Administered 2023-05-10: 1 mg via INTRAVENOUS

## 2023-05-10 MED ORDER — INSULIN ASPART 100 UNIT/ML IJ SOLN: 0.0000 [IU] | INTRAMUSCULAR | Status: DC | PRN

## 2023-05-10 MED ORDER — HYDROMORPHONE HCL 1 MG/ML IJ SOLN
INTRAMUSCULAR | Status: DC | PRN
  Administered 2023-05-10 (×2): .2 mg via INTRAVENOUS

## 2023-05-10 MED ORDER — ONDANSETRON HCL 4 MG/2ML IJ SOLN
INTRAMUSCULAR | Status: AC
  Filled 2023-05-10: qty 2

## 2023-05-10 MED ORDER — DEXAMETHASONE SODIUM PHOSPHATE 10 MG/ML IJ SOLN
INTRAMUSCULAR | Status: DC | PRN
  Administered 2023-05-10: 10 mg via INTRAVENOUS

## 2023-05-10 MED ORDER — METHOCARBAMOL 1000 MG/10ML IJ SOLN
INTRAMUSCULAR | Status: AC
  Filled 2023-05-10: qty 10

## 2023-05-10 MED ORDER — PHENYLEPHRINE HCL-NACL 20-0.9 MG/250ML-% IV SOLN
INTRAVENOUS | Status: DC | PRN
  Administered 2023-05-10: 50 ug/min via INTRAVENOUS

## 2023-05-10 MED ORDER — METHOCARBAMOL 1000 MG/10ML IJ SOLN
500.0000 mg | Freq: Four times a day (QID) | INTRAMUSCULAR | Status: DC | PRN
  Administered 2023-05-10: 500 mg via INTRAVENOUS

## 2023-05-10 MED ORDER — ASPIRIN 81 MG PO CHEW
81.0000 mg | CHEWABLE_TABLET | Freq: Two times a day (BID) | ORAL | Status: DC
  Administered 2023-05-10 – 2023-05-11 (×2): 81 mg via ORAL
  Filled 2023-05-10 (×2): qty 1

## 2023-05-10 MED ORDER — ZOLPIDEM TARTRATE 5 MG PO TABS: 5.0000 mg | ORAL_TABLET | Freq: Every evening | ORAL | Status: DC | PRN

## 2023-05-10 MED ORDER — KETAMINE HCL 50 MG/5ML IJ SOSY
PREFILLED_SYRINGE | INTRAMUSCULAR | Status: AC
  Filled 2023-05-10: qty 5

## 2023-05-10 MED ORDER — DOCUSATE SODIUM 100 MG PO CAPS
100.0000 mg | ORAL_CAPSULE | Freq: Two times a day (BID) | ORAL | Status: DC
  Administered 2023-05-10 – 2023-05-11 (×3): 100 mg via ORAL
  Filled 2023-05-10 (×3): qty 1

## 2023-05-10 MED ORDER — WATER FOR IRRIGATION, STERILE IR SOLN
Status: DC | PRN
  Administered 2023-05-10: 1000 mL

## 2023-05-10 MED ORDER — OXYCODONE HCL 5 MG PO TABS: 2.5000 mg | ORAL_TABLET | Freq: Four times a day (QID) | ORAL | 0 refills | Status: AC | PRN

## 2023-05-10 MED ORDER — FENTANYL CITRATE (PF) 100 MCG/2ML IJ SOLN
INTRAMUSCULAR | Status: DC | PRN
Start: 2023-05-10 — End: 2023-05-10
  Administered 2023-05-10 (×4): 25 ug via INTRAVENOUS
  Administered 2023-05-10: 50 ug via INTRAVENOUS
  Administered 2023-05-10 (×2): 25 ug via INTRAVENOUS

## 2023-05-10 MED ORDER — PHENYLEPHRINE 80 MCG/ML (10ML) SYRINGE FOR IV PUSH (FOR BLOOD PRESSURE SUPPORT)
PREFILLED_SYRINGE | INTRAVENOUS | Status: AC
  Filled 2023-05-10: qty 10

## 2023-05-10 MED ORDER — SODIUM CHLORIDE (PF) 0.9 % IJ SOLN
INTRAMUSCULAR | Status: DC | PRN
  Administered 2023-05-10: 80 mL

## 2023-05-10 MED ORDER — ONDANSETRON HCL 4 MG/2ML IJ SOLN: 4.0000 mg | Freq: Four times a day (QID) | INTRAMUSCULAR | Status: DC | PRN

## 2023-05-10 MED ORDER — PHENYLEPHRINE HCL (PRESSORS) 10 MG/ML IV SOLN: INTRAVENOUS | Status: DC | PRN

## 2023-05-10 MED ORDER — POVIDONE-IODINE 10 % EX SWAB
2.0000 | Freq: Once | CUTANEOUS | Status: AC
  Administered 2023-05-10: 2 via TOPICAL

## 2023-05-10 MED ORDER — LATANOPROST 0.005 % OP SOLN
1.0000 [drp] | Freq: Every day | OPHTHALMIC | Status: DC
  Administered 2023-05-10: 1 [drp] via OPHTHALMIC
  Filled 2023-05-10: qty 2.5

## 2023-05-10 MED ORDER — DEXAMETHASONE SODIUM PHOSPHATE 10 MG/ML IJ SOLN: 4.0000 mg | Freq: Once | INTRAMUSCULAR | Status: DC

## 2023-05-10 MED ORDER — BUPIVACAINE-EPINEPHRINE (PF) 0.25% -1:200000 IJ SOLN
INTRAMUSCULAR | Status: AC
  Filled 2023-05-10: qty 30

## 2023-05-10 MED ORDER — 0.9 % SODIUM CHLORIDE (POUR BTL) OPTIME
TOPICAL | Status: DC | PRN
  Administered 2023-05-10: 1000 mL

## 2023-05-10 MED ORDER — CEFAZOLIN SODIUM-DEXTROSE 2-4 GM/100ML-% IV SOLN
2.0000 g | Freq: Four times a day (QID) | INTRAVENOUS | Status: AC
  Administered 2023-05-10 (×2): 2 g via INTRAVENOUS
  Filled 2023-05-10 (×2): qty 100

## 2023-05-10 MED ORDER — EPHEDRINE 5 MG/ML INJ
INTRAVENOUS | Status: AC
  Filled 2023-05-10: qty 5

## 2023-05-10 MED ORDER — ONDANSETRON HCL 4 MG PO TABS: 4.0000 mg | ORAL_TABLET | Freq: Four times a day (QID) | ORAL | Status: DC | PRN

## 2023-05-10 MED ORDER — LIDOCAINE HCL (PF) 2 % IJ SOLN
INTRAMUSCULAR | Status: AC
  Filled 2023-05-10: qty 5

## 2023-05-10 MED ORDER — EPHEDRINE SULFATE-NACL 50-0.9 MG/10ML-% IV SOSY
PREFILLED_SYRINGE | INTRAVENOUS | Status: DC | PRN
  Administered 2023-05-10: 10 mg via INTRAVENOUS

## 2023-05-10 MED ORDER — FENTANYL CITRATE PF 50 MCG/ML IJ SOSY
PREFILLED_SYRINGE | INTRAMUSCULAR | Status: AC
  Filled 2023-05-10: qty 1

## 2023-05-10 MED ORDER — ROSUVASTATIN CALCIUM 20 MG PO TABS
40.0000 mg | ORAL_TABLET | Freq: Every day | ORAL | Status: DC
  Administered 2023-05-11: 40 mg via ORAL
  Filled 2023-05-10: qty 2

## 2023-05-10 MED ORDER — DORZOLAMIDE HCL-TIMOLOL MAL 2-0.5 % OP SOLN
1.0000 [drp] | Freq: Two times a day (BID) | OPHTHALMIC | Status: DC
  Administered 2023-05-10 – 2023-05-11 (×2): 1 [drp] via OPHTHALMIC
  Filled 2023-05-10: qty 10

## 2023-05-10 MED ORDER — KETOROLAC TROMETHAMINE 15 MG/ML IJ SOLN
7.5000 mg | Freq: Four times a day (QID) | INTRAMUSCULAR | Status: AC
  Administered 2023-05-10 – 2023-05-11 (×4): 7.5 mg via INTRAVENOUS
  Filled 2023-05-10 (×4): qty 1

## 2023-05-10 MED ORDER — SODIUM CHLORIDE (PF) 0.9 % IJ SOLN
INTRAMUSCULAR | Status: AC
  Filled 2023-05-10: qty 10

## 2023-05-10 MED ORDER — DEXAMETHASONE SODIUM PHOSPHATE 10 MG/ML IJ SOLN
INTRAMUSCULAR | Status: DC | PRN
  Administered 2023-05-10: 10 mg

## 2023-05-10 MED ORDER — TRANEXAMIC ACID-NACL 1000-0.7 MG/100ML-% IV SOLN
1000.0000 mg | INTRAVENOUS | Status: AC
  Administered 2023-05-10: 1000 mg via INTRAVENOUS
  Filled 2023-05-10: qty 100

## 2023-05-10 MED ORDER — ISOPROPYL ALCOHOL 70 % SOLN
Status: DC | PRN
  Administered 2023-05-10: 1 via TOPICAL

## 2023-05-10 MED ORDER — ACETAMINOPHEN 500 MG PO TABS
1000.0000 mg | ORAL_TABLET | Freq: Once | ORAL | Status: AC
  Administered 2023-05-10: 1000 mg via ORAL
  Filled 2023-05-10: qty 2

## 2023-05-10 SURGICAL SUPPLY — 54 items
BAG COUNTER SPONGE SURGICOUNT (BAG) IMPLANT
BLADE SAG 18X100X1.27 (BLADE) ×1 IMPLANT
BLADE SAW SAG 35X64 .89 (BLADE) ×1 IMPLANT
BLADE SAW SGTL 11.0X1.19X90.0M (BLADE) IMPLANT
BNDG COHESIVE 3X5 TAN ST LF (GAUZE/BANDAGES/DRESSINGS) ×1 IMPLANT
BNDG ELASTIC 6X10 VLCR STRL LF (GAUZE/BANDAGES/DRESSINGS) ×1 IMPLANT
BOWL SMART MIX CTS (DISPOSABLE) ×1 IMPLANT
CEMENT BONE R 1X40 (Cement) IMPLANT
CEMENT BONE REFOBACIN R1X40 US (Cement) IMPLANT
CHLORAPREP W/TINT 26 (MISCELLANEOUS) ×2 IMPLANT
COMPONENT FEM CR CEMT STD SZ5 (Joint) IMPLANT
COVER SURGICAL LIGHT HANDLE (MISCELLANEOUS) ×1 IMPLANT
CUFF TRNQT CYL 34X4.125X (TOURNIQUET CUFF) ×1 IMPLANT
DERMABOND ADVANCED .7 DNX12 (GAUZE/BANDAGES/DRESSINGS) ×1 IMPLANT
DRAPE INCISE IOBAN 85X60 (DRAPES) ×1 IMPLANT
DRAPE SHEET LG 3/4 BI-LAMINATE (DRAPES) ×1 IMPLANT
DRAPE U-SHAPE 47X51 STRL (DRAPES) ×1 IMPLANT
DRSG AQUACEL AG ADV 3.5X10 (GAUZE/BANDAGES/DRESSINGS) ×1 IMPLANT
ELECT REM PT RETURN 15FT ADLT (MISCELLANEOUS) ×1 IMPLANT
GAUZE SPONGE 4X4 12PLY STRL (GAUZE/BANDAGES/DRESSINGS) ×1 IMPLANT
GLOVE BIO SURGEON STRL SZ 6.5 (GLOVE) ×2 IMPLANT
GLOVE BIOGEL PI IND STRL 6.5 (GLOVE) ×1 IMPLANT
GLOVE BIOGEL PI IND STRL 8 (GLOVE) ×1 IMPLANT
GLOVE SURG ORTHO 8.0 STRL STRW (GLOVE) ×2 IMPLANT
GOWN STRL REUS W/ TWL XL LVL3 (GOWN DISPOSABLE) ×2 IMPLANT
HOLDER FOLEY CATH W/STRAP (MISCELLANEOUS) ×1 IMPLANT
HOOD PEEL AWAY T7 (MISCELLANEOUS) ×3 IMPLANT
IMPL ASF RT PSN 4-5/CD 10 (Joint) IMPLANT
KIT TURNOVER KIT A (KITS) ×1 IMPLANT
MANIFOLD NEPTUNE II (INSTRUMENTS) ×1 IMPLANT
MARKER SKIN DUAL TIP RULER LAB (MISCELLANEOUS) ×1 IMPLANT
NS IRRIG 1000ML POUR BTL (IV SOLUTION) ×1 IMPLANT
PACK TOTAL KNEE CUSTOM (KITS) ×1 IMPLANT
PENCIL SMOKE EVACUATOR (MISCELLANEOUS) ×1 IMPLANT
PIN DRILL HDLS TROCAR 75 4PK (PIN) IMPLANT
SCREW HEADED 33MM KNEE (MISCELLANEOUS) IMPLANT
SET HNDPC FAN SPRY TIP SCT (DISPOSABLE) ×1 IMPLANT
SOLUTION IRRIG SURGIPHOR (IV SOLUTION) IMPLANT
SOLUTION PRONTOSAN WOUND 350ML (IRRIGATION / IRRIGATOR) IMPLANT
STEM POLY PAT PLY 32M KNEE (Knees) IMPLANT
STEM TIBIA 5 DEG SZ D R KNEE (Knees) IMPLANT
STRIP CLOSURE SKIN 1/2X4 (GAUZE/BANDAGES/DRESSINGS) ×1 IMPLANT
SUT MNCRL AB 3-0 PS2 18 (SUTURE) ×1 IMPLANT
SUT STRATAFIX 14 PDO 48 VLT (SUTURE) ×1 IMPLANT
SUT STRATAFIX PDO 1 14 VIOLET (SUTURE) ×1 IMPLANT
SUT VIC AB 0 CT1 36 (SUTURE) ×1 IMPLANT
SUT VIC AB 2-0 CT2 27 (SUTURE) ×2 IMPLANT
SUTURE STRATFX 0 PDS 27 VIOLET (SUTURE) ×1 IMPLANT
SYR 50ML LL SCALE MARK (SYRINGE) ×1 IMPLANT
TRAY FOLEY MTR SLVR 14FR STAT (SET/KITS/TRAYS/PACK) IMPLANT
TUBE SUCTION HIGH CAP CLEAR NV (SUCTIONS) ×1 IMPLANT
TUBING CONNECTING 10 (TUBING) IMPLANT
UNDERPAD 30X36 HEAVY ABSORB (UNDERPADS AND DIAPERS) ×1 IMPLANT
WRAP KNEE MAXI GEL POST OP (GAUZE/BANDAGES/DRESSINGS) ×1 IMPLANT

## 2023-05-10 NOTE — Progress Notes (Signed)
 Orthopedic Tech Progress Note Patient Details:  Allen Sanchez 05-20-1945 [id number]  Ortho Devices Type of Ortho Device: Bone foam zero knee Ortho Device/Splint Interventions: Ordered     Patient not yet in recovery, bone foam dropped off at bedside in PACU 2. Toi Foster 05/10/2023, 9:35 AM

## 2023-05-10 NOTE — Anesthesia Postprocedure Evaluation (Signed)
 Anesthesia Post Note  Patient: Allen Sanchez  Procedure(s) Performed: ARTHROPLASTY, KNEE, TOTAL (Right: Knee)     Patient location during evaluation: PACU Anesthesia Type: General Level of consciousness: awake and alert Pain management: pain level controlled Vital Signs Assessment: post-procedure vital signs reviewed and stable Respiratory status: spontaneous breathing, nonlabored ventilation, respiratory function stable and patient connected to nasal cannula oxygen Cardiovascular status: blood pressure returned to baseline and stable Postop Assessment: no apparent nausea or vomiting Anesthetic complications: no   No notable events documented.  Last Vitals:  Vitals:   05/10/23 1103 05/10/23 1304  BP: 128/85 (!) 107/57  Pulse: 94 (!) 56  Resp: 17 15  Temp: (!) 36.4 C 36.6 C  SpO2: 100% 100%    Last Pain:  Vitals:   05/10/23 1304  TempSrc: Axillary  PainSc:                  Leslye Rast

## 2023-05-10 NOTE — Interval H&P Note (Signed)

## 2023-05-10 NOTE — Op Note (Signed)
 DATE OF SURGERY:  05/10/2023 TIME: 9:06 AM  PATIENT NAME:  Allen Sanchez   AGE: 78 y.o.    PRE-OPERATIVE DIAGNOSIS: End-stage right knee osteoarthritis  POST-OPERATIVE DIAGNOSIS:  Same  PROCEDURE: Cemented right total Knee Arthroplasty  SURGEON:  Donise Woodle A Aftin Lye, MD   ASSISTANT: Mason Sole, PA-C, present and scrubbed throughout the case, critical for assistance with exposure, retraction, instrumentation, and closure.   OPERATIVE IMPLANTS:  Zimmer persona cemented size 5 standard femur, D right tibial baseplate, 10 mm MC poly, 32 mm cemented all poly patella Implant Name Type Inv. Item Serial No. Manufacturer Lot No. LRB No. Used Action  STEM POLY PAT PLY 77M KNEE - [id number] Knees STEM POLY PAT PLY 77M KNEE  ZIMMER RECON(ORTH,TRAU,BIO,SG) [id number] Right 1 Implanted  STEM TIBIA 5 DEG SZ D R KNEE - [id number] Knees STEM TIBIA 5 DEG SZ D R KNEE  ZIMMER RECON(ORTH,TRAU,BIO,SG) [id number] Right 1 Implanted  COMPONENT FEM CR CEMT STD SZ5 - [id number] Joint COMPONENT FEM CR CEMT STD SZ5  ZIMMER RECON(ORTH,TRAU,BIO,SG) [id number] Right 1 Implanted  CEMENT BONE REFOBACIN R1X40 US  - [id number] Cement CEMENT BONE REFOBACIN R1X40 US   ZIMMER RECON(ORTH,TRAU,BIO,SG) [id number] Right 2 Implanted  IMPL ASF RT PSN 4-5/CD 10 - [id number] Joint IMPL ASF RT PSN 4-5/CD 10  ZIMMER RECON(ORTH,TRAU,BIO,SG) [id number] Right 1 Implanted      PREOPERATIVE INDICATIONS:  Allen Sanchez is a 78 y.o. year old male with end stage bone on bone degenerative arthritis of the knee who failed conservative treatment, including injections, antiinflammatories, activity modification, and assistive devices, and had significant impairment of their activities of daily living, and elected for Total Knee Arthroplasty.   The risks, benefits, and alternatives were discussed at length including but not limited to the risks of infection, bleeding, nerve injury, stiffness, blood clots, the need for revision surgery,  cardiopulmonary complications, among others, and they were willing to proceed.  ESTIMATED BLOOD LOSS: 50cc  OPERATIVE DESCRIPTION:   Once adequate anesthesia was induced, preoperative antibiotics, 2 gm of ancef,1 gm of Tranexamic Acid, and 8 mg of Decadron  administered, the patient was positioned supine with a right thigh tourniquet placed.  The right lower extremity was prepped and draped in sterile fashion.  A time-  out was performed identifying the patient, planned procedure, and the appropriate extremity.     The leg was  exsanguinated, tourniquet elevated to 250 mmHg.  A midline incision was  made followed by median parapatellar arthrotomy. Anterior horn of the medial meniscus was released and resected. A medial release was performed, the infrapatellar fat pad was resected with care taken to protect the patellar tendon. The suprapatellar fat was removed to exposed the distal anterior femur. The anterior horn of the lateral meniscus and ACL were released.    Following initial  exposure, I first started with the femur  The femoral  canal was opened with a drill, canal was suctioned to try to prevent fat emboli.  An  intramedullary rod was passed set at 5 degrees valgus, 10 mm. The distal femur was resected.  Following this resection, the tibia was  subluxated anteriorly.  Using the extramedullary guide, 10 mm of bone was resected off   the proximal lateral tibia.  We confirmed the gap would be  stable medially and laterally with a size 10mm spacer block as well as confirmed that the tibial cut was perpendicular in the coronal plane, checking with an alignment rod.    Once this was done, the posterior femoral referencing femoral  sizer was placed under to the posterior condyles with 3 degrees of external rotational which was parallel to the transepicondylar axis and perpendicular to [employer]. The femur was sized to be a size 5 in the anterior-  posterior dimension. The  anterior, posterior, and   chamfer cuts were made without difficulty nor   notching making certain that I was along the anterior cortex to help  with flexion gap stability. Next a laminar spreader was placed with the knee in flexion and the medial lateral menisci were resected.  5 cc of the Exparel mixture was injected in the medial side of the back of the knee and 3 cc in the lateral side.  1/2 inch curved osteotome was used to resect posterior osteophyte that was then removed with a pituitary rongeur.       At this point, the tibia was sized to be a size D.  The size D tray was  then pinned in position. Trial reduction was now carried with a 5 femur, D tibia, a 10 mm MC insert.  The knee had full extension and was stable to varus valgus stress in extension.  The knee was slightly tight in flexion and the PCL was partially released.   Attention was next directed to the patella.  Precut  measurement was noted to be 24 mm.  I resected down to 14 mm and used a  32mm patellar button to restore patellar height as well as cover the cut surface.     The patella lug holes were drilled and a 32mm patella poly trial was placed.    The knee was brought to full extension with good flexion stability with the patella tracking through the trochlea without application of pressure.     Next the femoral component was again assessed and determined to be seated and appropriately lateralized.  The femoral lug holes were drilled.  The femoral component was then removed. Tibial component was again assessed and felt to be seated and appropriately rotated with the medial third of the tubercle. The tibia was then drilled, and keel punched.     Final components were  opened and cement was mixed.      Final implants were then  cemented onto cleaned and dried cut surfaces of bone with the knee brought to extension with a 10 mm MC poly.  The knee was irrigated with sterile Betadine diluted in saline as well as pulse lavage normal saline.  The synovial  lining was  then injected a dilute Exparel with 30cc of 0.25% marcaine with epinephrine.         Once the cement had fully cured, excess cement was removed throughout the knee.  I confirmed that I was satisfied with the range of motion and stability, and the final 10mm MC poly insert was chosen.  It was placed into the knee.         The tourniquet had been let down at 60 minutes.  No significant hemostasis was required.  The medial parapatellar arthrotomy was then reapproximated using #1 Stratafix sutures with the knee  in flexion.  The remaining wound was closed with 0 stratafix, 2-0 Vicryl, and running 3-0 Monocryl. The knee was cleaned, dried, dressed sterilely using Dermabond and   Aquacel dressing.  The patient was then brought to recovery room in stable condition, tolerating the procedure  well. There were no complications.   Post op recs: WB: WBAT Abx: ancef Imaging: PACU xrays DVT prophylaxis: Aspirin 81mg  BID x4  weeks Follow up: 2 weeks after surgery for a wound check with Dr. Pryor Browning at [facility].  Address: [address], [place], [place] [address]  Office Phone: [phone]  Priscille Brought, MD Orthopaedic Surgery

## 2023-05-10 NOTE — Addendum Note (Signed)
 Addendum  created 05/10/23 1546 by Vella Gey, CRNA   Intraprocedure Meds edited

## 2023-05-10 NOTE — Anesthesia Procedure Notes (Signed)
 Anesthesia Regional Block: Adductor canal block   Pre-Anesthetic Checklist: , timeout performed,  Correct Patient, Correct Site, Correct Laterality,  Correct Procedure, Correct Position, site marked,  Risks and benefits discussed,  Surgical consent,  Pre-op evaluation,  At surgeon's request and post-op pain management  Laterality: Right  Prep: Maximum Sterile Barrier Precautions used, chloraprep       Needles:  Injection technique: Single-shot  Needle Type: Echogenic Needle      Needle Gauge: 20     Additional Needles:   Procedures:,,,, ultrasound used (permanent image in chart),,    Narrative:  Start time: 05/10/2023 7:08 AM End time: 05/10/2023 7:12 AM Injection made incrementally with aspirations every 5 mL.  Performed by: Personally  Anesthesiologist: Leslye Rast, MD

## 2023-05-10 NOTE — Evaluation (Signed)
 Physical Therapy Evaluation Patient Details Name: Allen Sanchez MRN: [id number] DOB: 04-Nov-1945 Today's Date: 05/10/2023  History of Present Illness  78 yo male s/p R TKA on 05/10/23. PMHx: DM, HTN, HLD.  Clinical Impression  Pt is s/p TKA resulting in the deficits listed below (see PT Problem List).  Pt doing well, pain controlled this afternoon. Amb ~ 1' with RW and min assist d/t intermittent R knee buckling/decr quad activation (likely d/t residual effects of regional nerve block). Anticipate steady progress in acute setting   Pt will benefit from acute skilled PT to increase their independence and safety with mobility to allow discharge.          If plan is discharge home, recommend the following: A little help with walking and/or transfers;A little help with bathing/dressing/bathroom;Assistance with cooking/housework;Assist for transportation   Can travel by private vehicle        Equipment Recommendations Rolling walker (2 wheels)  Recommendations for Other Services       Functional Status Assessment Patient has had a recent decline in their functional status and demonstrates the ability to make significant improvements in function in a reasonable and predictable amount of time.     Precautions / Restrictions Precautions Precautions: Fall;Knee Restrictions Weight Bearing Restrictions Per Provider Order: No Other Position/Activity Restrictions: WBAT      Mobility  Bed Mobility Overal bed mobility: Needs Assistance Bed Mobility: Supine to Sit     Supine to sit: Contact guard     General bed mobility comments: for safety, inct time. verbal cues for sequence and self assist    Transfers Overall transfer level: Needs assistance Equipment used: Rolling walker (2 wheels) Transfers: Sit to/from Stand Sit to Stand: Min assist           General transfer comment: verbal cues for hand placement and RLE position. light assist to rise and transition to RW     Ambulation/Gait Ambulation/Gait assistance: Min assist Gait Distance (Feet): 25 Feet Assistive device: Rolling walker (2 wheels) Gait Pattern/deviations: Step-to pattern       General Gait Details: verbal cues for sequence, right knee extension in stance and use of UEs to offload RLE d/t intermittent knee buckling  Stairs            Wheelchair Mobility     Tilt Bed    Modified Rankin (Stroke Patients Only)       Balance Overall balance assessment: Mild deficits observed, not formally tested                                           Pertinent Vitals/Pain Pain Assessment Pain Assessment: 0-10 Pain Location: right knee Pain Descriptors / Indicators: Aching, Discomfort, Sore Pain Intervention(s): Limited activity within patient's tolerance, Monitored during session, Premedicated before session    Home Living Family/patient expects to be discharged to:: Private residence Living Arrangements: Spouse/significant other Available Help at Discharge: Family Type of Home: Apartment Home Access: Level entry       Home Layout: One level Home Equipment: Jeananne Mighty - single point      Prior Function Prior Level of Function : Independent/Modified Independent                     Extremity/Trunk Assessment   Upper Extremity Assessment Upper Extremity Assessment: Overall WFL for tasks assessed    Lower Extremity Assessment Lower Extremity Assessment: (  P) RLE deficits/detail RLE Deficits / Details: ankle WFL, knee extension and hip flexion grossly2+/5 with ~ 15 degree quad lag, limitations d/t post op weakness/anesthesia       Communication   Communication Communication: No apparent difficulties    Cognition Arousal: Alert Behavior During Therapy: WFL for tasks assessed/performed   PT - Cognitive impairments: No apparent impairments                         Following commands: Intact       Cueing Cueing Techniques: Verbal  cues     General Comments      Exercises Total Joint Exercises Ankle Circles/Pumps: AROM, Both, 5 reps Quad Sets: 5 reps, Both, AROM   Assessment/Plan    PT Assessment Patient needs continued PT services  PT Problem List Decreased strength;Decreased range of motion;Decreased activity tolerance;Decreased mobility;Decreased knowledge of use of DME       PT Treatment Interventions DME instruction;Therapeutic exercise;Gait training;Functional mobility training;Therapeutic activities;Patient/family education    PT Goals (Current goals can be found in the Care Plan section)  Acute Rehab PT Goals Patient Stated Goal: less knee pain PT Goal Formulation: With patient Time For Goal Achievement: 05/17/23 Potential to Achieve Goals: Good    Frequency 7X/week     Co-evaluation               AM-PAC PT "6 Clicks" Mobility  Outcome Measure Help needed turning from your back to your side while in a flat bed without using bedrails?: A Little Help needed moving from lying on your back to sitting on the side of a flat bed without using bedrails?: A Little Help needed moving to and from a bed to a chair (including a wheelchair)?: A Little Help needed standing up from a chair using your arms (e.g., wheelchair or bedside chair)?: A Little Help needed to walk in hospital room?: A Lot Help needed climbing 3-5 steps with a railing? : A Lot 6 Click Score: 16    End of Session Equipment Utilized During Treatment: Gait belt Activity Tolerance: Patient tolerated treatment well Patient left: with call bell/phone within reach;in chair;with chair alarm set Nurse Communication: Mobility status PT Visit Diagnosis: Other abnormalities of gait and mobility (R26.89);Difficulty in walking, not elsewhere classified (R26.2)    Time: 4098-1191 PT Time Calculation (min) (ACUTE ONLY): 28 min   Charges:   PT Evaluation $PT Eval Low Complexity: 1 Low PT Treatments $Gait Training: 8-22 mins PT  General Charges $$ ACUTE PT VISIT: 1 Visit         Joyell Emami, PT  Acute Rehab Dept [facility]) [phone]  05/10/2023   [facility] 05/10/2023, 5:23 PM

## 2023-05-10 NOTE — Discharge Instructions (Signed)

## 2023-05-10 NOTE — Anesthesia Procedure Notes (Addendum)
 Procedure Name: LMA Insertion Date/Time: 05/10/2023 7:32 AM  Performed by: Vella Gey, CRNAPatient Re-evaluated:Patient Re-evaluated prior to induction Oxygen Delivery Method: Circle system utilized Preoxygenation: Pre-oxygenation with 100% oxygen Induction Type: IV induction LMA: LMA inserted LMA Size: 4.0 Placement Confirmation: positive ETCO2 and breath sounds checked- equal and bilateral Tube secured with: Tape Dental Injury: Teeth and Oropharynx as per pre-operative assessment

## 2023-05-10 NOTE — Transfer of Care (Signed)
 Immediate Anesthesia Transfer of Care Note  Patient: Dontai Tilley Brine  Procedure(s) Performed: ARTHROPLASTY, KNEE, TOTAL (Right: Knee)  Patient Location: PACU  Anesthesia Type:GA combined with regional for post-op pain  Level of Consciousness: drowsy  Airway & Oxygen Therapy: Patient Spontanous Breathing and Patient connected to face mask oxygen  Post-op Assessment: Report given to RN and Post -op Vital signs reviewed and stable  Post vital signs: Reviewed and stable  Last Vitals:  Vitals Value Taken Time  BP    Temp    Pulse 32 05/10/23 0942  Resp 20 05/10/23 0942  SpO2 100 % 05/10/23 0942  Vitals shown include unfiled device data.  Last Pain:  Vitals:   05/10/23 0627  TempSrc:   PainSc: 0-No pain         Complications: No notable events documented.

## 2023-05-10 NOTE — Plan of Care (Signed)
   Problem: Activity: Goal: Risk for activity intolerance will decrease Outcome: Progressing   Problem: Nutrition: Goal: Adequate nutrition will be maintained Outcome: Progressing   Problem: Pain Managment: Goal: General experience of comfort will improve and/or be controlled Outcome: Progressing   Problem: Safety: Goal: Ability to remain free from injury will improve Outcome: Progressing

## 2023-05-11 DIAGNOSIS — M1711 Unilateral primary osteoarthritis, right knee: Secondary | ICD-10-CM | POA: Diagnosis not present

## 2023-05-11 DIAGNOSIS — Z96651 Presence of right artificial knee joint: Secondary | ICD-10-CM | POA: Diagnosis not present

## 2023-05-11 DIAGNOSIS — E119 Type 2 diabetes mellitus without complications: Secondary | ICD-10-CM | POA: Diagnosis not present

## 2023-05-11 DIAGNOSIS — Z79899 Other long term (current) drug therapy: Secondary | ICD-10-CM | POA: Diagnosis not present

## 2023-05-11 LAB — BASIC METABOLIC PANEL WITH GFR
Anion gap: 7 (ref 5–15)
BUN: 20 mg/dL (ref 8–23)
CO2: 20 mmol/L — ABNORMAL LOW (ref 22–32)
Calcium: 9.1 mg/dL (ref 8.9–10.3)
Chloride: 108 mmol/L (ref 98–111)
Creatinine, Ser: 0.62 mg/dL (ref 0.61–1.24)
GFR, Estimated: >60 mL/min (ref >60)
Glucose, Bld: 140 mg/dL — ABNORMAL HIGH (ref 70–99)
Potassium: 4.3 mmol/L (ref 3.5–5.1)
Sodium: 135 mmol/L (ref 135–145)

## 2023-05-11 LAB — CBC
HCT: 33.9 % — ABNORMAL LOW (ref 39.0–52.0)
Hemoglobin: 11 g/dL — ABNORMAL LOW (ref 13.0–17.0)
MCH: 28.5 pg (ref 26.0–34.0)
MCHC: 32.4 g/dL (ref 30.0–36.0)
MCV: 87.8 fL (ref 80.0–100.0)
Platelets: 95 10*3/uL — ABNORMAL LOW (ref 150–400)
RBC: 3.86 MIL/uL — ABNORMAL LOW (ref 4.22–5.81)
RDW: 14.5 % (ref 11.5–15.5)
WBC: 9.7 10*3/uL (ref 4.0–10.5)
nRBC: 0 % (ref 0.0–0.2)

## 2023-05-11 LAB — GLUCOSE, CAPILLARY
Glucose-Capillary: 130 mg/dL — ABNORMAL HIGH (ref 70–99)
Glucose-Capillary: 130 mg/dL — ABNORMAL HIGH (ref 70–99)

## 2023-05-11 MED ORDER — CALCIUM CARBONATE ANTACID 500 MG PO CHEW: 1.0000 | CHEWABLE_TABLET | Freq: Two times a day (BID) | ORAL | Status: DC

## 2023-05-11 NOTE — Discharge Summary (Signed)
 Physician Discharge Summary  Patient ID: Allen Sanchez MRN: [id number] DOB/AGE: [birth year] 78 y.o.  Admit date: 05/10/2023 Discharge date: 05/11/2023  Admission Diagnoses:  Primary osteoarthritis of right knee  Discharge Diagnoses:  Principal Problem:   Primary osteoarthritis of right knee   Past Medical History:  Diagnosis Date   Aortic stenosis    severe in 03/2023   Arthritis    knee   Diabetes mellitus without complication (HCC)    Eczema    ED (erectile dysfunction)    GERD (gastroesophageal reflux disease)    Horseshoe kidney    Hyperlipidemia    Neuromuscular disorder (HCC)    Sciatic nerve pain greater left -under physical therapy now   PAC (premature atrial contraction)     Surgeries: Procedure(s): ARTHROPLASTY, KNEE, TOTAL on 05/10/2023   Consultants (if any):   Discharged Condition: Improved  Hospital Course: Allen Sanchez is an 78 y.o. male who was admitted 05/10/2023 with a diagnosis of Primary osteoarthritis of right knee and went to the operating room on 05/10/2023 and underwent the above named procedures.    He was given perioperative antibiotics:  Anti-infectives (From admission, onward)    Start     Dose/Rate Route Frequency Ordered Stop   05/10/23 1330  ceFAZolin (ANCEF) IVPB 2g/100 mL premix        2 g 200 mL/hr over 30 Minutes Intravenous Every 6 hours 05/10/23 1056 05/10/23 2018   05/10/23 0600  ceFAZolin (ANCEF) IVPB 2g/100 mL premix        2 g 200 mL/hr over 30 Minutes Intravenous On call to O.R. 05/10/23 [id number] 05/10/23 0745     .  He was given sequential compression devices, early ambulation, and aspirin for DVT prophylaxis.  He benefited maximally from the hospital stay and there were no complications.    Recent vital signs:  Vitals:   05/11/23 0236 05/11/23 0547  BP: 113/80 106/63  Pulse: (!) 52 83  Resp: 15 18  Temp: 97.6 F (36.4 C) (!) 97.4 F (36.3 C)  SpO2: 99% 100%    Recent laboratory studies:  Lab Results  Component  Value Date   HGB 11.0 (L) 05/11/2023   HGB 12.9 (L) 04/27/2023   HGB 14.7 02/06/2017   Lab Results  Component Value Date   WBC 9.7 05/11/2023   PLT 95 (L) 05/11/2023   No results found for: "INR" Lab Results  Component Value Date   NA 135 05/11/2023   K 4.3 05/11/2023   CL 108 05/11/2023   CO2 20 (L) 05/11/2023   BUN 20 05/11/2023   CREATININE 0.62 05/11/2023   GLUCOSE 140 (H) 05/11/2023    Discharge Medications:   Allergies as of 05/11/2023       Reactions   Crestor  [rosuvastatin ] Other (See Comments)   muscle weakness    Lipitor [atorvastatin] Other (See Comments)   muscle weakness    Pravastatin Other (See Comments)   myalgias   Zetia [ezetimibe] Other (See Comments)   muscle weakness         Medication List     STOP taking these medications    acetaminophen 650 MG CR tablet Commonly known as: TYLENOL Replaced by: acetaminophen 500 MG tablet   cetirizine  10 MG tablet Commonly known as: ZyrTEC  Allergy   diclofenac 75 MG EC tablet Commonly known as: VOLTAREN   guaiFENesin  100 MG/5ML liquid Commonly known as: ROBITUSSIN       TAKE these medications    acetaminophen 500 MG tablet Commonly known  as: TYLENOL Take 2 tablets (1,000 mg total) by mouth every 8 (eight) hours as needed. Replaces: acetaminophen 650 MG CR tablet   aspirin EC 81 MG tablet Take 1 tablet (81 mg total) by mouth 2 (two) times daily for 28 days. Swallow whole.   brimonidine 0.2 % ophthalmic solution Commonly known as: ALPHAGAN Place 1 drop into both eyes in the morning and at bedtime.   celecoxib 100 MG capsule Commonly known as: CeleBREX Take 1 capsule (100 mg total) by mouth 2 (two) times daily for 14 days.   Cialis 20 MG tablet Generic drug: tadalafil Take 20 mg by mouth daily as needed for erectile dysfunction.   dorzolamide-timolol 2-0.5 % ophthalmic solution Commonly known as: COSOPT Place 1 drop into both eyes 2 (two) times daily.   Fish Oil 1200 MG Caps Take  1,200 mg by mouth daily.   hydrocortisone 2.5 % cream Apply 1 Application topically daily as needed (eczema).   latanoprost 0.005 % ophthalmic solution Commonly known as: XALATAN Place 1 drop into both eyes at bedtime.   methocarbamol 500 MG tablet Commonly known as: ROBAXIN Take 1 tablet (500 mg total) by mouth every 8 (eight) hours as needed for up to 10 days for muscle spasms.   Multi Vitamin Daily Tabs Take 1 tablet by mouth daily.   omeprazole 40 MG capsule Commonly known as: PRILOSEC Take 1 capsule (40 mg total) by mouth daily for 21 days.   ondansetron 4 MG tablet Commonly known as: Zofran Take 1 tablet (4 mg total) by mouth every 8 (eight) hours as needed for up to 14 days for nausea or vomiting.   oxyCODONE 5 MG immediate release tablet Commonly known as: Roxicodone Take 0.5-1 tablets (2.5-5 mg total) by mouth every 6 (six) hours as needed for up to 7 days for severe pain (pain score 7-10) or moderate pain (pain score 4-6).   polyethylene glycol 17 g packet Commonly known as: MiraLax Take 17 g by mouth daily.   rosuvastatin  40 MG tablet Commonly known as: CRESTOR  TAKE 1 TABLET(40 MG) BY MOUTH DAILY   triamcinolone  cream 0.1 % Commonly known as: KENALOG Apply 1 Application topically 2 (two) times daily as needed (eczema).        Diagnostic Studies: DG Knee Right Port Result Date: 05/10/2023 CLINICAL DATA:  Postop. EXAM: PORTABLE RIGHT KNEE - 1-2 VIEW COMPARISON:  None Available. FINDINGS: Right knee arthroplasty in expected alignment. No periprosthetic lucency or fracture. There has been patellar resurfacing. Recent postsurgical change includes air and edema in the soft tissues and joint space. IMPRESSION: Right knee arthroplasty without immediate postoperative complication. Electronically Signed   By: Chadwick Colonel M.D.   On: 05/10/2023 11:39    Disposition: Discharge disposition: 01-Home or Self Care       Discharge Instructions     Call MD / Call  911   Complete by: As directed    If you experience chest pain or shortness of breath, CALL 911 and be transported to the hospital emergency room.  If you develope a fever above 101 F, pus (white drainage) or increased drainage or redness at the wound, or calf pain, call your surgeon's office.   Constipation Prevention   Complete by: As directed    Drink plenty of fluids.  Prune juice may be helpful.  You may use a stool softener, such as Colace (over the counter) 100 mg twice a day.  Use MiraLax (over the counter) for constipation as needed.   Diet -  low sodium heart healthy   Complete by: As directed    Increase activity slowly as tolerated   Complete by: As directed    Post-operative opioid taper instructions:   Complete by: As directed    POST-OPERATIVE OPIOID TAPER INSTRUCTIONS: It is important to wean off of your opioid medication as soon as possible. If you do not need pain medication after your surgery it is ok to stop day one. Opioids include: Codeine, Hydrocodone(Norco, Vicodin), Oxycodone(Percocet, oxycontin) and hydromorphone amongst others.  Long term and even short term use of opiods can cause: Increased pain response Dependence Constipation Depression Respiratory depression And more.  Withdrawal symptoms can include Flu like symptoms Nausea, vomiting And more Techniques to manage these symptoms Hydrate well Eat regular healthy meals Stay active Use relaxation techniques(deep breathing, meditating, yoga) Do Not substitute Alcohol to help with tapering If you have been on opioids for less than two weeks and do not have pain than it is ok to stop all together.  Plan to wean off of opioids This plan should start within one week post op of your joint replacement. Maintain the same interval or time between taking each dose and first decrease the dose.  Cut the total daily intake of opioids by one tablet each day Next start to increase the time between doses. The last  dose that should be eliminated is the evening dose.           Follow-up Information     Murleen Arms, MD. Go on 05/04/2023.   Specialty: Orthopedic Surgery Why: your appointment is scheduled for 2:45 Contact information: [address] [place] [address] [phone]         [facility] Go to.   Why: HHPT will provide 6 home visits prior to starting outpatient physical therapy        [facility] Go on 05/25/2023.   Why: your outpatient physical therapy has been scheduled. they will call you with a time Contact information: [facility] [address] [place] [address] [phone]                    Discharge Instructions      INSTRUCTIONS AFTER JOINT REPLACEMENT   Remove items at home which could result in a fall. This includes throw rugs or furniture in walking pathways ICE to the affected joint every three hours while awake for 30 minutes at a time, for at least the first 3-5 days, and then as needed for pain and swelling.  Continue to use ice for pain and swelling. You may notice swelling that will progress down to the foot and ankle.  This is normal after surgery.  Elevate your leg when you are not up walking on it.   Continue to use the breathing machine you got in the hospital (incentive spirometer) which will help keep your temperature down.  It is common for your temperature to cycle up and down following surgery, especially at night when you are not up moving around and exerting yourself.  The breathing machine keeps your lungs expanded and your temperature down.  DIET:  As you were doing prior to hospitalization, we recommend a well-balanced diet.  DRESSING / WOUND CARE / SHOWERING:  Keep the surgical dressing until follow up.  The dressing is water proof, so you can shower without any extra covering.  IF THE DRESSING FALLS OFF or the wound gets wet inside, change the  dressing with sterile gauze.  Please use good hand washing techniques before changing the dressing.  Do not use any lotions or creams on the incision until instructed by your surgeon.    ACTIVITY  Increase activity slowly as tolerated, but follow the weight bearing instructions below.   No driving for 6 weeks or until further direction given by your physician.  You cannot drive while taking narcotics.  No lifting or carrying greater than 10 lbs. until further directed by your surgeon. Avoid periods of inactivity such as sitting longer than an hour when not asleep. This helps prevent blood clots.  You may return to work once you are authorized by your doctor.   WEIGHT BEARING: Weight bearing as tolerated with assist device (walker, cane, etc) as directed, use it as long as suggested by your surgeon or therapist, typically at least 4-6 weeks.  EXERCISES  Results after joint replacement surgery are often greatly improved when you follow the exercise, range of motion and muscle strengthening exercises prescribed by your doctor. Safety measures are also important to protect the joint from further injury. Any time any of these exercises cause you to have increased pain or swelling, decrease what you are doing until you are comfortable again and then slowly increase them. If you have problems or questions, call your caregiver or physical therapist for advice.   Rehabilitation is important following a joint replacement. After just a few days of immobilization, the muscles of the leg can become weakened and shrink (atrophy).  These exercises are designed to build up the tone and strength of the thigh and leg muscles and to improve motion. Often times heat used for twenty to thirty minutes before working out will loosen up your tissues and help with improving the range of motion but do not use heat for the first two weeks following surgery (sometimes heat can increase post-operative swelling).   These  exercises can be done on a training (exercise) mat, on the floor, on a table or on a bed. Use whatever works the best and is most comfortable for you.    Use music or television while you are exercising so that the exercises are a pleasant break in your day. This will make your life better with the exercises acting as a break in your routine that you can look forward to.   Perform all exercises about fifteen times, three times per day or as directed.  You should exercise both the operative leg and the other leg as well.  Exercises include:   Quad Sets - Tighten up the muscle on the front of the thigh (Quad) and hold for 5-10 seconds.   Straight Leg Raises - With your knee straight (if you were given a brace, keep it on), lift the leg to 60 degrees, hold for 3 seconds, and slowly lower the leg.  Perform this exercise against resistance later as your leg gets stronger.  Leg Slides: Lying on your back, slowly slide your foot toward your buttocks, bending your knee up off the floor (only go as far as is comfortable). Then slowly slide your foot back down until your leg is flat on the floor again.  Angel Wings: Lying on your back spread your legs to the side as far apart as you can without causing discomfort.  Hamstring Strength:  Lying on your back, push your heel against the floor with your leg straight by tightening up the muscles of your buttocks.  Repeat, but this time bend your  knee to a comfortable angle, and push your heel against the floor.  You may put a pillow under the heel to make it more comfortable if necessary.   A rehabilitation program following joint replacement surgery can speed recovery and prevent re-injury in the future due to weakened muscles. Contact your doctor or a physical therapist for more information on knee rehabilitation.   CONSTIPATION:  Constipation is defined medically as fewer than three stools per week and severe constipation as less than one stool per week.  Even if you  have a regular bowel pattern at home, your normal regimen is likely to be disrupted due to multiple reasons following surgery.  Combination of anesthesia, postoperative narcotics, change in appetite and fluid intake all can affect your bowels.   YOU MUST use at least one of the following options; they are listed in order of increasing strength to get the job done.  They are all available over the counter, and you may need to use some, POSSIBLY even all of these options:    Drink plenty of fluids (prune juice may be helpful) and high fiber foods Colace 100 mg by mouth twice a day  Senokot for constipation as directed and as needed Dulcolax (bisacodyl), take with full glass of water  Miralax (polyethylene glycol) once or twice a day as needed.  If you have tried all these things and are unable to have a bowel movement in the first 3-4 days after surgery call either your surgeon or your primary doctor.    If you experience loose stools or diarrhea, hold the medications until you stool forms back up.  If your symptoms do not get better within 1 week or if they get worse, check with your doctor.  If you experience "the worst abdominal pain ever" or develop nausea or vomiting, please contact the office immediately for further recommendations for treatment.  ITCHING:  If you experience itching with your medications, try taking only a single pain pill, or even half a pain pill at a time.  You can also use Benadryl over the counter for itching or also to help with sleep.   TED HOSE STOCKINGS:  Use stockings on both legs until for at least 2 weeks or as directed by physician office. They may be removed at night for sleeping.  MEDICATIONS:  See your medication summary on the "After Visit Summary" that nursing will review with you.  You may have some home medications which will be placed on hold until you complete the course of blood thinner medication.  It is important for you to complete the blood thinner  medication as prescribed.  Blood clot prevention (DVT Prophylaxis): After surgery you are at an increased risk for a blood clot. you were prescribed a blood thinner, Aspirin 81mg , to be taken twice daily for a total of 4 weeks from surgery to help reduce your risk of getting a blood clot.  Signs of a pulmonary embolus (blood clot in the lungs) include sudden short of breath, feeling lightheaded or dizzy, chest pain with a deep breath, rapid pulse rapid breathing.  Signs of a blood clot in your arms or legs include new unexplained swelling and cramping, warm, red or darkened skin around the painful area.  Please call the office or 911 right away if these signs or symptoms develop.  PRECAUTIONS:   If you experience chest pain or shortness of breath - call 911 immediately for transfer to the hospital emergency department.   If you  develop a fever greater that 101 F, purulent drainage from wound, increased redness or drainage from wound, foul odor from the wound/dressing, or calf pain - CONTACT YOUR SURGEON.                                                   FOLLOW-UP APPOINTMENTS:  If you do not already have a post-op appointment, please call the office for an appointment to be seen by your surgeon.  Guidelines for how soon to be seen are listed in your "After Visit Summary", but are typically between 2-3 weeks after surgery.  If you have a specialized bandage, you may be told to follow up 1 week after surgery.  OTHER INSTRUCTIONS:  Knee Replacement:  Do not place pillow under knee, focus on keeping the knee straight while resting.  Place foam block, curve side up under heel at all times except when walking.  DO NOT modify, tear, cut, or change the foam block in any way.  POST-OPERATIVE OPIOID TAPER INSTRUCTIONS: It is important to wean off of your opioid medication as soon as possible. If you do not need pain medication after your surgery it is ok to stop day one. Opioids include: Codeine,  Hydrocodone(Norco, Vicodin), Oxycodone(Percocet, oxycontin) and hydromorphone amongst others.  Long term and even short term use of opiods can cause: Increased pain response Dependence Constipation Depression Respiratory depression And more.  Withdrawal symptoms can include Flu like symptoms Nausea, vomiting And more Techniques to manage these symptoms Hydrate well Eat regular healthy meals Stay active Use relaxation techniques(deep breathing, meditating, yoga) Do Not substitute Alcohol to help with tapering If you have been on opioids for less than two weeks and do not have pain than it is ok to stop all together.  Plan to wean off of opioids This plan should start within one week post op of your joint replacement. Maintain the same interval or time between taking each dose and first decrease the dose.  Cut the total daily intake of opioids by one tablet each day Next start to increase the time between doses. The last dose that should be eliminated is the evening dose.   MAKE SURE YOU:  Understand these instructions.  Get help right away if you are not doing well or get worse.    Thank you for letting us  be a part of your medical care team.  It is a privilege we respect greatly.  We hope these instructions will help you stay on track for a fast and full recovery!            Signed: Leib Elahi A Kieth Hartis 05/11/2023, 6:25 AM

## 2023-05-11 NOTE — Plan of Care (Signed)
  Problem: Coping: Goal: Level of anxiety will decrease Outcome: Progressing   Problem: Safety: Goal: Ability to remain free from injury will improve Outcome: Progressing   Problem: Education: Goal: Knowledge of the prescribed therapeutic regimen will improve Outcome: Progressing   Problem: Activity: Goal: Range of joint motion will improve Outcome: Progressing   Problem: Pain Management: Goal: Pain level will decrease with appropriate interventions Outcome: Progressing

## 2023-05-11 NOTE — Progress Notes (Signed)
 PT note   05/11/23 1500  PT Visit Information  Last PT Received On 05/11/23  Assistance Needed Pt progressing well, improved gait stability with pt demonstrating incr safety with turns and RW Position. Pt wife and son present for session. Pt spouse confirms they will walk up the handicapped incline and avoid walking up the step/curb to sidewalk. Pt is ready to d/c from standpoint  History of Present Illness 78 yo male s/p R TKA on 05/10/23. PMHx: DM, HTN, HLD.  Subjective Data  Patient Stated Goal less knee pain  Precautions  Precautions Fall;Knee  Recall of Precautions/Restrictions Intact  Precaution/Restrictions Comments reviewed use of  bonefoam for home use  Restrictions  Weight Bearing Restrictions Per Provider Order No  Other Position/Activity Restrictions WBAT  Pain Assessment  Pain Assessment 0-10  Faces Pain Scale 4  Pain Location right thigh, just  proximal to knee  Pain Descriptors / Indicators Discomfort;Grimacing;Sore  Pain Intervention(s) Limited activity within patient's tolerance;Monitored during session;Premedicated before session;Repositioned;Ice applied  Cognition  Arousal Alert  Behavior During Therapy WFL for tasks assessed/performed  Following Commands  Following commands Intact  Cueing  Cueing Techniques Verbal cues;Tactile cues;Visual cues  Communication  Communication No apparent difficulties  Bed Mobility  General bed mobility comments in recliner and returned to same  Transfers  Overall transfer level Needs assistance  Equipment used Rolling walker (2 wheels)  Transfers Sit to/from Stand  Sit to Stand Contact guard assist (bed ht slightly elevated to simulate home)  General transfer comment verbal cues for hand placement and RLE position. CGA to rise and transition to RW  Ambulation/Gait  Ambulation/Gait assistance Contact guard assist  Assistive device Rolling walker (2 wheels)  Gait Pattern/deviations Step-to pattern;Wide base of support;Decreased  dorsiflexion - right;Decreased dorsiflexion - left (bil LEs external rotation)  General Gait Details multi-modal cues for sequence, RW position and safety.  Gait velocity decr  PT - End of Session  Equipment Utilized During Treatment Gait belt  Activity Tolerance Patient tolerated treatment well  Patient left with call bell/phone within reach;in chair;with chair alarm set  Nurse Communication Mobility status   PT - Assessment/Plan  PT Frequency (ACUTE ONLY) 7X/week  Follow Up Recommendations Follow physician's recommendations for discharge plan and follow up therapies  Patient can return home with the following A little help with walking and/or transfers;A little help with bathing/dressing/bathroom;Assistance with cooking/housework;Assist for transportation  PT equipment Rolling walker (2 wheels)  AM-PAC PT "6 Clicks" Mobility Outcome Measure (Version 2)  Help needed turning from your back to your side while in a flat bed without using bedrails? 3  Help needed moving from lying on your back to sitting on the side of a flat bed without using bedrails? 3  Help needed moving to and from a bed to a chair (including a wheelchair)? 3  Help needed standing up from a chair using your arms (e.g., wheelchair or bedside chair)? 3  Help needed to walk in hospital room? 3  Help needed climbing 3-5 steps with a railing?  3  6 Click Score 18  Consider Recommendation of Discharge To: Home with [facility]  Progressive Mobility  What is the highest level of mobility based on the progressive mobility assessment? Level 5 (Walks with assist in room/hall) - Balance while stepping forward/back and can walk in room with assist - Complete  PT Goal Progression  Progress towards PT goals Progressing toward goals  Acute Rehab PT Goals  PT Goal Formulation With patient  Time For Goal Achievement  05/17/23  Potential to Achieve Goals Good  PT Time Calculation  PT Start Time (ACUTE ONLY) 1438  PT Stop Time (ACUTE ONLY)  1457  PT Time Calculation (min) (ACUTE ONLY) 19 min  PT General Charges  $$ ACUTE PT VISIT 1 Visit  PT Treatments  $Gait Training 8-22 mins

## 2023-05-11 NOTE — TOC Transition Note (Signed)
 Transition of Care [facility] - Discharge Note   Patient Details  Name: LEIAM RITTENHOUSE MRN: [id number] Date of Birth: 07-24-1945  Transition of Care [facility]) CM/SW Contact:  Bari Leys, RN Phone Number: 05/11/2023, 10:35 AM   Clinical Narrative:   Met with patient at bedside to review dc therapy and home DME needs, pt confirmed HH PT with [facility] for 6 visits then OPPT at [facility] on [address], RW delivered to bedside by [facility]. No TOC needs.     Final next level of care: Home w Home Health Services Barriers to Discharge: No Barriers Identified   Patient Goals and CMS Choice Patient states their goals for this hospitalization and ongoing recovery are:: return home          Discharge Placement                       Discharge Plan and Services Additional resources added to the After Visit Summary for                  DME Arranged: Walker rolling DME Agency: [employer]       HH Arranged: PT HH Agency: [facility]        Social Drivers of Health (SDOH) Interventions SDOH Screenings   Food Insecurity: No Food Insecurity (05/10/2023)  Housing: Low Risk  (05/10/2023)  Transportation Needs: No Transportation Needs (05/10/2023)  Utilities: Not At Risk (05/10/2023)  Social Connections: Socially Integrated (05/10/2023)  Tobacco Use: Low Risk  (05/10/2023)     Readmission Risk Interventions     No data to display

## 2023-05-11 NOTE — Progress Notes (Signed)
     Subjective:  Patient reports pain as mild.  Worked well with physical therapy yesterday.  Pain well-controlled.  Plan for additional mobilization today.  Patient hopeful for discharge home today.  Objective:   VITALS:   Vitals:   05/10/23 1304 05/10/23 2244 05/11/23 0236 05/11/23 0547  BP: (!) 107/57 (!) 109/58 113/80 106/63  Pulse: (!) 56 (!) 55 (!) 52 83  Resp: 15 16 15 18   Temp: 97.8 F (36.6 C) 98 F (36.7 C) 97.6 F (36.4 C) (!) 97.4 F (36.3 C)  TempSrc: Axillary Oral Oral   SpO2: 100% 99% 99% 100%  Weight:      Height:        Sensation intact distally Intact pulses distally Dorsiflexion/Plantar flexion intact Incision: dressing C/D/I Compartment soft    Lab Results  Component Value Date   WBC 9.7 05/11/2023   HGB 11.0 (L) 05/11/2023   HCT 33.9 (L) 05/11/2023   MCV 87.8 05/11/2023   PLT 95 (L) 05/11/2023   BMET    Component Value Date/Time   NA 135 05/11/2023 0416   K 4.3 05/11/2023 0416   CL 108 05/11/2023 0416   CO2 20 (L) 05/11/2023 0416   GLUCOSE 140 (H) 05/11/2023 0416   BUN 20 05/11/2023 0416   CREATININE 0.62 05/11/2023 0416   CALCIUM  9.1 05/11/2023 0416   GFRNONAA >60 05/11/2023 0416      Xray: TKA components in good position no adverse features  Assessment/Plan: 1 Day Post-Op   Principal Problem:   Primary osteoarthritis of right knee  Status post right TKA 05/10/2023  Post op recs: WB: WBAT Abx: ancef Imaging: PACU xrays DVT prophylaxis: Aspirin 81mg  BID x4 weeks Follow up: 2 weeks after surgery for a wound check with Dr. Pryor Browning at [facility].  Address: [address], [place], [place] [address]  Office Phone: [phone]     Murleen Arms 05/11/2023, 6:23 AM   Priscille Brought, MD  Contact information:   [phone] 7am-5pm epic message Dr. Pryor Browning, or call office for patient follow up: [phone] After hours and holidays please check [email or web address] for group call information for  [facility]

## 2023-05-11 NOTE — Care Management Obs Status (Signed)
 MEDICARE OBSERVATION STATUS NOTIFICATION   Patient Details  Name: Allen Sanchez MRN: [id number] Date of Birth: 14-Mar-1945   Medicare Observation Status Notification Given:  Yes    Bari Leys, RN 05/11/2023, 9:32 AM

## 2023-05-11 NOTE — Progress Notes (Signed)
 Physical Therapy Treatment Patient Details Name: Allen Sanchez MRN: [id number] DOB: [birth year] Today's Date: 05/11/2023   History of Present Illness 78 yo male s/p R TKA on 05/10/23. PMHx: DM, HTN, HLD.    PT Comments  Pt progressing steadily toward goals this session. Pt overall CGA to min assist for functional mobility. Wt shift to RLE improved with no knee buckling during gait, pain controlled. Will benefit from second session of PT later today for reinforcement of safe transfer/gait techniques as well as family education. Anticipate d/c after pm PT session. RN updated on plan.    If plan is discharge home, recommend the following: A little help with walking and/or transfers;A little help with bathing/dressing/bathroom;Assistance with cooking/housework;Assist for transportation   Can travel by private vehicle        Equipment Recommendations  Rolling walker (2 wheels)    Recommendations for Other Services       Precautions / Restrictions Precautions Precautions: Fall;Knee Recall of Precautions/Restrictions: Intact Precaution/Restrictions Comments: reviewed RLE positioning/terminal knee extension, pillow placement to avoid hip external rotation and knee flexion when at rest, pt verbalizes understanding; pt also has bonefoam for home use Restrictions Weight Bearing Restrictions Per Provider Order: No Other Position/Activity Restrictions: WBAT     Mobility  Bed Mobility Overal bed mobility: Needs Assistance Bed Mobility: Supine to Sit     Supine to sit: Supervision, Contact guard     General bed mobility comments: RLE guided briefly to progress toward EOB, with incr time pt ulimately able to complete without physical assist, supervision for safety    Transfers Overall transfer level: Needs assistance Equipment used: Rolling walker (2 wheels) Transfers: Sit to/from Stand Sit to Stand: Contact guard assist, From elevated surface (bed ht slightly elevated to simulate  home)           General transfer comment: verbal cues for hand placement and RLE position. CGA to rise and transition to RW    Ambulation/Gait Ambulation/Gait assistance: Min assist, Contact guard assist Gait Distance (Feet): 70 Feet Assistive device: Rolling walker (2 wheels) Gait Pattern/deviations: Step-to pattern, Wide base of support, Decreased dorsiflexion - right, Decreased dorsiflexion - left (bil LEs external rotation) Gait velocity: decr     General Gait Details: multi-modal cues for sequence, RW position and safety, intermittent assist to steady and maneuver RW. wt shift to RLE improved with no knee buckling noted   Stairs Stairs: Yes Stairs assistance: Min assist, Contact guard assist Stair Management: No rails, Step to pattern, Forwards, With walker Number of Stairs: 1 (x2) General stair comments: mutli-modal, repetitious cues for sequence, RW positioning, and safety; light assist to steady and place RW   Wheelchair Mobility     Tilt Bed    Modified Rankin (Stroke Patients Only)       Balance                                            Communication Communication Communication: No apparent difficulties  Cognition Arousal: Alert Behavior During Therapy: WFL for tasks assessed/performed   PT - Cognitive impairments: Memory, Sequencing                       PT - Cognition Comments: pt Ox3; requires repetitious cues intermittently for safety and sequencing of functional  tasks. Following commands: Intact      Cueing Cueing Techniques: Verbal  cues, Tactile cues, Visual cues  Exercises Total Joint Exercises Ankle Circles/Pumps: AROM, Both, 5 reps Quad Sets: 5 reps, Both, AROM Heel Slides: AROM, Right, 10 reps Straight Leg Raises: AROM, Right (3 reps, no quad lag) Long Arc Quad: AROM, Right, 10 reps, Seated Knee flexion AROM grossly ~5 to ~75 degrees    General Comments        Pertinent Vitals/Pain Pain  Assessment Pain Assessment: Faces Faces Pain Scale: Hurts little more Pain Location: right thigh, just  proximal to knee Pain Descriptors / Indicators: Discomfort, Grimacing, Sore Pain Intervention(s): Limited activity within patient's tolerance, Monitored during session, Repositioned, Premedicated before session, Ice applied    Home Living                          Prior Function            PT Goals (current goals can now be found in the care plan section) Acute Rehab PT Goals Patient Stated Goal: less knee pain PT Goal Formulation: With patient Time For Goal Achievement: 05/17/23 Potential to Achieve Goals: Good Progress towards PT goals: Progressing toward goals    Frequency    7X/week      PT Plan      Co-evaluation              AM-PAC PT "6 Clicks" Mobility   Outcome Measure  Help needed turning from your back to your side while in a flat bed without using bedrails?: A Little Help needed moving from lying on your back to sitting on the side of a flat bed without using bedrails?: A Little Help needed moving to and from a bed to a chair (including a wheelchair)?: A Little Help needed standing up from a chair using your arms (e.g., wheelchair or bedside chair)?: A Little Help needed to walk in hospital room?: A Little Help needed climbing 3-5 steps with a railing? : A Little 6 Click Score: 18    End of Session Equipment Utilized During Treatment: Gait belt Activity Tolerance: Patient tolerated treatment well Patient left: with call bell/phone within reach;in chair;with chair alarm set Nurse Communication: Mobility status       Time: 1610-9604 PT Time Calculation (min) (ACUTE ONLY): 22 min  Charges:    $Gait Training: 8-22 mins PT General Charges $$ ACUTE PT VISIT: 1 Visit                     Eldene Plocher, PT  Acute Rehab Dept [facility]) [phone]  05/11/2023    [facility] 05/11/2023, 11:25 AM

## 2023-05-12 ENCOUNTER — Encounter (HOSPITAL_COMMUNITY): Payer: Self-pay | Admitting: Orthopedic Surgery

## 2023-05-12 DIAGNOSIS — I491 Atrial premature depolarization: Secondary | ICD-10-CM | POA: Diagnosis not present

## 2023-05-12 DIAGNOSIS — Z471 Aftercare following joint replacement surgery: Secondary | ICD-10-CM | POA: Diagnosis not present

## 2023-05-12 DIAGNOSIS — E11620 Type 2 diabetes mellitus with diabetic dermatitis: Secondary | ICD-10-CM | POA: Diagnosis not present

## 2023-05-12 DIAGNOSIS — Z6835 Body mass index (BMI) 35.0-35.9, adult: Secondary | ICD-10-CM | POA: Diagnosis not present

## 2023-05-12 DIAGNOSIS — N529 Male erectile dysfunction, unspecified: Secondary | ICD-10-CM | POA: Diagnosis not present

## 2023-05-12 DIAGNOSIS — Z7982 Long term (current) use of aspirin: Secondary | ICD-10-CM | POA: Diagnosis not present

## 2023-05-12 DIAGNOSIS — I350 Nonrheumatic aortic (valve) stenosis: Secondary | ICD-10-CM | POA: Diagnosis not present

## 2023-05-12 DIAGNOSIS — Z96651 Presence of right artificial knee joint: Secondary | ICD-10-CM | POA: Diagnosis not present

## 2023-05-12 DIAGNOSIS — Z791 Long term (current) use of non-steroidal anti-inflammatories (NSAID): Secondary | ICD-10-CM | POA: Diagnosis not present

## 2023-05-12 DIAGNOSIS — Z79891 Long term (current) use of opiate analgesic: Secondary | ICD-10-CM | POA: Diagnosis not present

## 2023-05-12 DIAGNOSIS — E785 Hyperlipidemia, unspecified: Secondary | ICD-10-CM | POA: Diagnosis not present

## 2023-05-12 DIAGNOSIS — E663 Overweight: Secondary | ICD-10-CM | POA: Diagnosis not present

## 2023-05-12 DIAGNOSIS — K219 Gastro-esophageal reflux disease without esophagitis: Secondary | ICD-10-CM | POA: Diagnosis not present

## 2023-05-12 DIAGNOSIS — N319 Neuromuscular dysfunction of bladder, unspecified: Secondary | ICD-10-CM | POA: Diagnosis not present

## 2023-05-15 DIAGNOSIS — E11620 Type 2 diabetes mellitus with diabetic dermatitis: Secondary | ICD-10-CM | POA: Diagnosis not present

## 2023-05-15 DIAGNOSIS — Z79891 Long term (current) use of opiate analgesic: Secondary | ICD-10-CM | POA: Diagnosis not present

## 2023-05-15 DIAGNOSIS — Z791 Long term (current) use of non-steroidal anti-inflammatories (NSAID): Secondary | ICD-10-CM | POA: Diagnosis not present

## 2023-05-15 DIAGNOSIS — Z471 Aftercare following joint replacement surgery: Secondary | ICD-10-CM | POA: Diagnosis not present

## 2023-05-15 DIAGNOSIS — Z6835 Body mass index (BMI) 35.0-35.9, adult: Secondary | ICD-10-CM | POA: Diagnosis not present

## 2023-05-15 DIAGNOSIS — Z7982 Long term (current) use of aspirin: Secondary | ICD-10-CM | POA: Diagnosis not present

## 2023-05-15 DIAGNOSIS — E663 Overweight: Secondary | ICD-10-CM | POA: Diagnosis not present

## 2023-05-15 DIAGNOSIS — N529 Male erectile dysfunction, unspecified: Secondary | ICD-10-CM | POA: Diagnosis not present

## 2023-05-15 DIAGNOSIS — I491 Atrial premature depolarization: Secondary | ICD-10-CM | POA: Diagnosis not present

## 2023-05-15 DIAGNOSIS — N319 Neuromuscular dysfunction of bladder, unspecified: Secondary | ICD-10-CM | POA: Diagnosis not present

## 2023-05-15 DIAGNOSIS — K219 Gastro-esophageal reflux disease without esophagitis: Secondary | ICD-10-CM | POA: Diagnosis not present

## 2023-05-15 DIAGNOSIS — E785 Hyperlipidemia, unspecified: Secondary | ICD-10-CM | POA: Diagnosis not present

## 2023-05-15 DIAGNOSIS — Z96651 Presence of right artificial knee joint: Secondary | ICD-10-CM | POA: Diagnosis not present

## 2023-05-15 DIAGNOSIS — I350 Nonrheumatic aortic (valve) stenosis: Secondary | ICD-10-CM | POA: Diagnosis not present

## 2023-05-17 DIAGNOSIS — Z7982 Long term (current) use of aspirin: Secondary | ICD-10-CM | POA: Diagnosis not present

## 2023-05-17 DIAGNOSIS — K219 Gastro-esophageal reflux disease without esophagitis: Secondary | ICD-10-CM | POA: Diagnosis not present

## 2023-05-17 DIAGNOSIS — E11620 Type 2 diabetes mellitus with diabetic dermatitis: Secondary | ICD-10-CM | POA: Diagnosis not present

## 2023-05-17 DIAGNOSIS — Z791 Long term (current) use of non-steroidal anti-inflammatories (NSAID): Secondary | ICD-10-CM | POA: Diagnosis not present

## 2023-05-17 DIAGNOSIS — E785 Hyperlipidemia, unspecified: Secondary | ICD-10-CM | POA: Diagnosis not present

## 2023-05-17 DIAGNOSIS — Z96651 Presence of right artificial knee joint: Secondary | ICD-10-CM | POA: Diagnosis not present

## 2023-05-17 DIAGNOSIS — E663 Overweight: Secondary | ICD-10-CM | POA: Diagnosis not present

## 2023-05-17 DIAGNOSIS — N319 Neuromuscular dysfunction of bladder, unspecified: Secondary | ICD-10-CM | POA: Diagnosis not present

## 2023-05-17 DIAGNOSIS — Z79891 Long term (current) use of opiate analgesic: Secondary | ICD-10-CM | POA: Diagnosis not present

## 2023-05-17 DIAGNOSIS — Z6835 Body mass index (BMI) 35.0-35.9, adult: Secondary | ICD-10-CM | POA: Diagnosis not present

## 2023-05-17 DIAGNOSIS — I350 Nonrheumatic aortic (valve) stenosis: Secondary | ICD-10-CM | POA: Diagnosis not present

## 2023-05-17 DIAGNOSIS — Z471 Aftercare following joint replacement surgery: Secondary | ICD-10-CM | POA: Diagnosis not present

## 2023-05-17 DIAGNOSIS — I491 Atrial premature depolarization: Secondary | ICD-10-CM | POA: Diagnosis not present

## 2023-05-17 DIAGNOSIS — N529 Male erectile dysfunction, unspecified: Secondary | ICD-10-CM | POA: Diagnosis not present

## 2023-05-18 ENCOUNTER — Ambulatory Visit: Attending: Pharmacist | Admitting: Pharmacist

## 2023-05-18 NOTE — Progress Notes (Deleted)
 Patient ID: Allen Sanchez                 DOB: September 02, 1945                    MRN: [id number]     HPI: Allen Sanchez is a 78 y.o. male patient referred to lipid clinic by ***. PMH is significant for   Current Medications:  Intolerances:  Risk Factors:  LDL goal:   Diet:   Exercise:   Family History:   Social History:   Labs:  Past Medical History:  Diagnosis Date   Aortic stenosis    severe in 03/2023   Arthritis    knee   Diabetes mellitus without complication (HCC)    Eczema    ED (erectile dysfunction)    GERD (gastroesophageal reflux disease)    Horseshoe kidney    Hyperlipidemia    Neuromuscular disorder (HCC)    Sciatic nerve pain greater left -under physical therapy now   PAC (premature atrial contraction)     Current Outpatient Medications on File Prior to Visit  Medication Sig Dispense Refill   acetaminophen  (TYLENOL ) 500 MG tablet Take 2 tablets (1,000 mg total) by mouth every 8 (eight) hours as needed.     aspirin  EC 81 MG tablet Take 1 tablet (81 mg total) by mouth 2 (two) times daily for 28 days. Swallow whole.     brimonidine  (ALPHAGAN ) 0.2 % ophthalmic solution Place 1 drop into both eyes in the morning and at bedtime.     celecoxib  (CELEBREX ) 100 MG capsule Take 1 capsule (100 mg total) by mouth 2 (two) times daily for 14 days. 28 capsule 0   CIALIS 20 MG tablet Take 20 mg by mouth daily as needed for erectile dysfunction.  5   dorzolamide -timolol  (COSOPT ) 2-0.5 % ophthalmic solution Place 1 drop into both eyes 2 (two) times daily.     hydrocortisone  2.5 % cream Apply 1 Application topically daily as needed (eczema).     latanoprost  (XALATAN ) 0.005 % ophthalmic solution Place 1 drop into both eyes at bedtime.     methocarbamol  (ROBAXIN ) 500 MG tablet Take 1 tablet (500 mg total) by mouth every 8 (eight) hours as needed for up to 10 days for muscle spasms. 30 tablet 0   Multiple Vitamin (MULTI VITAMIN DAILY) TABS Take 1 tablet by mouth daily.      Omega-3 Fatty Acids (FISH OIL) 1200 MG CAPS Take 1,200 mg by mouth daily.     omeprazole  (PRILOSEC) 40 MG capsule Take 1 capsule (40 mg total) by mouth daily for 21 days. 21 capsule 0   ondansetron  (ZOFRAN ) 4 MG tablet Take 1 tablet (4 mg total) by mouth every 8 (eight) hours as needed for up to 14 days for nausea or vomiting. 14 tablet 0   polyethylene glycol (MIRALAX ) 17 g packet Take 17 g by mouth daily. 14 each 0   rosuvastatin  (CRESTOR ) 40 MG tablet TAKE 1 TABLET(40 MG) BY MOUTH DAILY 90 tablet 3   triamcinolone  cream (KENALOG) 0.1 % Apply 1 Application topically 2 (two) times daily as needed (eczema).     No current facility-administered medications on file prior to visit.    Allergies  Allergen Reactions   Crestor  [Rosuvastatin ] Other (See Comments)    muscle weakness    Lipitor [Atorvastatin] Other (See Comments)    muscle weakness    Pravastatin Other (See Comments)    myalgias   Zetia [Ezetimibe] Other (See  Comments)    muscle weakness     Assessment/Plan:  1. Hyperlipidemia -  Reviewed options for lowering LDL cholesterol, including statins, ezetimibe, PCSK9 inhibitors, Nexletol/Nexlizet, and Leqvio. Discussed efficacy, dosing, side effects, and copay information.

## 2023-05-19 DIAGNOSIS — Z471 Aftercare following joint replacement surgery: Secondary | ICD-10-CM | POA: Diagnosis not present

## 2023-05-19 DIAGNOSIS — K219 Gastro-esophageal reflux disease without esophagitis: Secondary | ICD-10-CM | POA: Diagnosis not present

## 2023-05-19 DIAGNOSIS — N529 Male erectile dysfunction, unspecified: Secondary | ICD-10-CM | POA: Diagnosis not present

## 2023-05-19 DIAGNOSIS — Z79891 Long term (current) use of opiate analgesic: Secondary | ICD-10-CM | POA: Diagnosis not present

## 2023-05-19 DIAGNOSIS — E663 Overweight: Secondary | ICD-10-CM | POA: Diagnosis not present

## 2023-05-19 DIAGNOSIS — E11620 Type 2 diabetes mellitus with diabetic dermatitis: Secondary | ICD-10-CM | POA: Diagnosis not present

## 2023-05-19 DIAGNOSIS — Z7982 Long term (current) use of aspirin: Secondary | ICD-10-CM | POA: Diagnosis not present

## 2023-05-19 DIAGNOSIS — E785 Hyperlipidemia, unspecified: Secondary | ICD-10-CM | POA: Diagnosis not present

## 2023-05-19 DIAGNOSIS — I491 Atrial premature depolarization: Secondary | ICD-10-CM | POA: Diagnosis not present

## 2023-05-19 DIAGNOSIS — N319 Neuromuscular dysfunction of bladder, unspecified: Secondary | ICD-10-CM | POA: Diagnosis not present

## 2023-05-19 DIAGNOSIS — Z6835 Body mass index (BMI) 35.0-35.9, adult: Secondary | ICD-10-CM | POA: Diagnosis not present

## 2023-05-19 DIAGNOSIS — I350 Nonrheumatic aortic (valve) stenosis: Secondary | ICD-10-CM | POA: Diagnosis not present

## 2023-05-19 DIAGNOSIS — Z96651 Presence of right artificial knee joint: Secondary | ICD-10-CM | POA: Diagnosis not present

## 2023-05-19 DIAGNOSIS — Z791 Long term (current) use of non-steroidal anti-inflammatories (NSAID): Secondary | ICD-10-CM | POA: Diagnosis not present

## 2023-05-21 DIAGNOSIS — Z7982 Long term (current) use of aspirin: Secondary | ICD-10-CM | POA: Diagnosis not present

## 2023-05-21 DIAGNOSIS — Z79891 Long term (current) use of opiate analgesic: Secondary | ICD-10-CM | POA: Diagnosis not present

## 2023-05-21 DIAGNOSIS — I350 Nonrheumatic aortic (valve) stenosis: Secondary | ICD-10-CM | POA: Diagnosis not present

## 2023-05-21 DIAGNOSIS — N319 Neuromuscular dysfunction of bladder, unspecified: Secondary | ICD-10-CM | POA: Diagnosis not present

## 2023-05-21 DIAGNOSIS — E785 Hyperlipidemia, unspecified: Secondary | ICD-10-CM | POA: Diagnosis not present

## 2023-05-21 DIAGNOSIS — N529 Male erectile dysfunction, unspecified: Secondary | ICD-10-CM | POA: Diagnosis not present

## 2023-05-21 DIAGNOSIS — Z6835 Body mass index (BMI) 35.0-35.9, adult: Secondary | ICD-10-CM | POA: Diagnosis not present

## 2023-05-21 DIAGNOSIS — E663 Overweight: Secondary | ICD-10-CM | POA: Diagnosis not present

## 2023-05-21 DIAGNOSIS — K219 Gastro-esophageal reflux disease without esophagitis: Secondary | ICD-10-CM | POA: Diagnosis not present

## 2023-05-21 DIAGNOSIS — Z96651 Presence of right artificial knee joint: Secondary | ICD-10-CM | POA: Diagnosis not present

## 2023-05-21 DIAGNOSIS — E11620 Type 2 diabetes mellitus with diabetic dermatitis: Secondary | ICD-10-CM | POA: Diagnosis not present

## 2023-05-21 DIAGNOSIS — Z791 Long term (current) use of non-steroidal anti-inflammatories (NSAID): Secondary | ICD-10-CM | POA: Diagnosis not present

## 2023-05-21 DIAGNOSIS — I491 Atrial premature depolarization: Secondary | ICD-10-CM | POA: Diagnosis not present

## 2023-05-21 DIAGNOSIS — Z471 Aftercare following joint replacement surgery: Secondary | ICD-10-CM | POA: Diagnosis not present

## 2023-05-24 DIAGNOSIS — Z471 Aftercare following joint replacement surgery: Secondary | ICD-10-CM | POA: Diagnosis not present

## 2023-05-24 DIAGNOSIS — Z96651 Presence of right artificial knee joint: Secondary | ICD-10-CM | POA: Diagnosis not present

## 2023-05-24 DIAGNOSIS — K219 Gastro-esophageal reflux disease without esophagitis: Secondary | ICD-10-CM | POA: Diagnosis not present

## 2023-05-24 DIAGNOSIS — I491 Atrial premature depolarization: Secondary | ICD-10-CM | POA: Diagnosis not present

## 2023-05-24 DIAGNOSIS — Z79891 Long term (current) use of opiate analgesic: Secondary | ICD-10-CM | POA: Diagnosis not present

## 2023-05-24 DIAGNOSIS — N529 Male erectile dysfunction, unspecified: Secondary | ICD-10-CM | POA: Diagnosis not present

## 2023-05-24 DIAGNOSIS — I350 Nonrheumatic aortic (valve) stenosis: Secondary | ICD-10-CM | POA: Diagnosis not present

## 2023-05-24 DIAGNOSIS — E11620 Type 2 diabetes mellitus with diabetic dermatitis: Secondary | ICD-10-CM | POA: Diagnosis not present

## 2023-05-24 DIAGNOSIS — Z7982 Long term (current) use of aspirin: Secondary | ICD-10-CM | POA: Diagnosis not present

## 2023-05-24 DIAGNOSIS — E785 Hyperlipidemia, unspecified: Secondary | ICD-10-CM | POA: Diagnosis not present

## 2023-05-24 DIAGNOSIS — Z791 Long term (current) use of non-steroidal anti-inflammatories (NSAID): Secondary | ICD-10-CM | POA: Diagnosis not present

## 2023-05-24 DIAGNOSIS — Z6835 Body mass index (BMI) 35.0-35.9, adult: Secondary | ICD-10-CM | POA: Diagnosis not present

## 2023-05-24 DIAGNOSIS — N319 Neuromuscular dysfunction of bladder, unspecified: Secondary | ICD-10-CM | POA: Diagnosis not present

## 2023-05-24 DIAGNOSIS — E663 Overweight: Secondary | ICD-10-CM | POA: Diagnosis not present

## 2023-05-24 DIAGNOSIS — M1711 Unilateral primary osteoarthritis, right knee: Secondary | ICD-10-CM | POA: Diagnosis not present

## 2023-05-25 DIAGNOSIS — M6281 Muscle weakness (generalized): Secondary | ICD-10-CM | POA: Diagnosis not present

## 2023-05-25 DIAGNOSIS — M1711 Unilateral primary osteoarthritis, right knee: Secondary | ICD-10-CM | POA: Diagnosis not present

## 2023-05-25 DIAGNOSIS — R262 Difficulty in walking, not elsewhere classified: Secondary | ICD-10-CM | POA: Diagnosis not present

## 2023-05-25 DIAGNOSIS — M25661 Stiffness of right knee, not elsewhere classified: Secondary | ICD-10-CM | POA: Diagnosis not present

## 2023-05-26 DIAGNOSIS — Z9889 Other specified postprocedural states: Secondary | ICD-10-CM | POA: Diagnosis not present

## 2023-05-26 DIAGNOSIS — I499 Cardiac arrhythmia, unspecified: Secondary | ICD-10-CM | POA: Diagnosis not present

## 2023-05-26 DIAGNOSIS — E7800 Pure hypercholesterolemia, unspecified: Secondary | ICD-10-CM | POA: Diagnosis not present

## 2023-05-26 DIAGNOSIS — D696 Thrombocytopenia, unspecified: Secondary | ICD-10-CM | POA: Diagnosis not present

## 2023-05-26 DIAGNOSIS — D649 Anemia, unspecified: Secondary | ICD-10-CM | POA: Diagnosis not present

## 2023-06-03 DIAGNOSIS — R262 Difficulty in walking, not elsewhere classified: Secondary | ICD-10-CM | POA: Diagnosis not present

## 2023-06-03 DIAGNOSIS — M25661 Stiffness of right knee, not elsewhere classified: Secondary | ICD-10-CM | POA: Diagnosis not present

## 2023-06-03 DIAGNOSIS — M6281 Muscle weakness (generalized): Secondary | ICD-10-CM | POA: Diagnosis not present

## 2023-06-03 DIAGNOSIS — M1711 Unilateral primary osteoarthritis, right knee: Secondary | ICD-10-CM | POA: Diagnosis not present

## 2023-06-08 DIAGNOSIS — M6281 Muscle weakness (generalized): Secondary | ICD-10-CM | POA: Diagnosis not present

## 2023-06-08 DIAGNOSIS — M25661 Stiffness of right knee, not elsewhere classified: Secondary | ICD-10-CM | POA: Diagnosis not present

## 2023-06-08 DIAGNOSIS — R262 Difficulty in walking, not elsewhere classified: Secondary | ICD-10-CM | POA: Diagnosis not present

## 2023-06-08 DIAGNOSIS — M1711 Unilateral primary osteoarthritis, right knee: Secondary | ICD-10-CM | POA: Diagnosis not present

## 2023-06-10 DIAGNOSIS — M1711 Unilateral primary osteoarthritis, right knee: Secondary | ICD-10-CM | POA: Diagnosis not present

## 2023-06-10 DIAGNOSIS — M6281 Muscle weakness (generalized): Secondary | ICD-10-CM | POA: Diagnosis not present

## 2023-06-10 DIAGNOSIS — M25661 Stiffness of right knee, not elsewhere classified: Secondary | ICD-10-CM | POA: Diagnosis not present

## 2023-06-10 DIAGNOSIS — R262 Difficulty in walking, not elsewhere classified: Secondary | ICD-10-CM | POA: Diagnosis not present

## 2023-06-15 DIAGNOSIS — M25661 Stiffness of right knee, not elsewhere classified: Secondary | ICD-10-CM | POA: Diagnosis not present

## 2023-06-15 DIAGNOSIS — R262 Difficulty in walking, not elsewhere classified: Secondary | ICD-10-CM | POA: Diagnosis not present

## 2023-06-15 DIAGNOSIS — M6281 Muscle weakness (generalized): Secondary | ICD-10-CM | POA: Diagnosis not present

## 2023-06-15 DIAGNOSIS — M1711 Unilateral primary osteoarthritis, right knee: Secondary | ICD-10-CM | POA: Diagnosis not present

## 2023-06-17 DIAGNOSIS — M1711 Unilateral primary osteoarthritis, right knee: Secondary | ICD-10-CM | POA: Diagnosis not present

## 2023-06-17 DIAGNOSIS — M25661 Stiffness of right knee, not elsewhere classified: Secondary | ICD-10-CM | POA: Diagnosis not present

## 2023-06-17 DIAGNOSIS — R262 Difficulty in walking, not elsewhere classified: Secondary | ICD-10-CM | POA: Diagnosis not present

## 2023-06-17 DIAGNOSIS — M6281 Muscle weakness (generalized): Secondary | ICD-10-CM | POA: Diagnosis not present

## 2023-06-22 ENCOUNTER — Telehealth: Payer: Self-pay | Admitting: *Deleted

## 2023-06-22 DIAGNOSIS — M25512 Pain in left shoulder: Secondary | ICD-10-CM | POA: Diagnosis not present

## 2023-06-22 DIAGNOSIS — M1711 Unilateral primary osteoarthritis, right knee: Secondary | ICD-10-CM | POA: Diagnosis not present

## 2023-06-22 NOTE — Telephone Encounter (Signed)
   Pre-operative Risk Assessment    Patient Name: Allen Sanchez  DOB: 03/07/1945 MRN: [id number]   Date of last office visit: 03/12/23 DR. BERRY Date of next office visit: NONE   Request for Surgical Clearance    Procedure:  LEFT TOTAL KNEE ARTHROPLASTY  Date of Surgery:  Clearance TBD                                Surgeon:  DR. DANIEL MARCHWIANY Surgeon's Group or Practice Name:  [facility] Phone number:  [phone] [address]T [address] Fax number:  [phone]   Type of Clearance Requested:   - Medical ; NONE INDICATED TO BE HELD PER FORM   Type of Anesthesia:  Spinal   Additional requests/questions:    Signed, Reannah Totten   06/22/2023, 5:00 PM

## 2023-06-23 NOTE — Telephone Encounter (Signed)
 Left message to call back to schedule tele pre op appt.

## 2023-06-23 NOTE — Telephone Encounter (Signed)
   Name: Allen Sanchez  DOB: 1945-04-15  MRN: [id number]  Primary Cardiologist: Lauro Portal, MD   Preoperative team, please contact this patient and set up a phone call appointment for further preoperative risk assessment. Please obtain consent and complete medication review. Thank you for your help.  I confirm that guidance regarding antiplatelet and oral anticoagulation therapy has been completed and, if necessary, noted below.  Dr. Katheryne Pane addressed in his note in March, which is greater than 60 days from request.   I also confirmed the patient resides in the state of [place] . As per [facility] Medical Board telemedicine laws, the patient must reside in the state in which the provider is licensed.   Lamond Pilot, PA 06/23/2023, 10:25 AM [facility]

## 2023-06-24 ENCOUNTER — Telehealth: Payer: Self-pay

## 2023-06-24 DIAGNOSIS — M1711 Unilateral primary osteoarthritis, right knee: Secondary | ICD-10-CM | POA: Diagnosis not present

## 2023-06-24 DIAGNOSIS — M6281 Muscle weakness (generalized): Secondary | ICD-10-CM | POA: Diagnosis not present

## 2023-06-24 DIAGNOSIS — R262 Difficulty in walking, not elsewhere classified: Secondary | ICD-10-CM | POA: Diagnosis not present

## 2023-06-24 DIAGNOSIS — M25661 Stiffness of right knee, not elsewhere classified: Secondary | ICD-10-CM | POA: Diagnosis not present

## 2023-06-24 NOTE — Telephone Encounter (Signed)
 Patient has been scheduled for tele preop appt med rec and consent done     Patient Consent for Virtual Visit         Allen Sanchez has provided verbal consent on 06/24/2023 for a virtual visit (video or telephone).   CONSENT FOR VIRTUAL VISIT FOR:  Allen Sanchez  By participating in this virtual visit I agree to the following:  I hereby voluntarily request, consent and authorize [facility] and its employed or contracted physicians, physician assistants, nurse practitioners or other licensed health care professionals (the Practitioner), to provide me with telemedicine health care services (the "Services) as deemed necessary by the treating Practitioner. I acknowledge and consent to receive the Services by the Practitioner via telemedicine. I understand that the telemedicine visit will involve communicating with the Practitioner through live audiovisual communication technology and the disclosure of certain medical information by electronic transmission. I acknowledge that I have been given the opportunity to request an in-person assessment or other available alternative prior to the telemedicine visit and am voluntarily participating in the telemedicine visit.  I understand that I have the right to withhold or withdraw my consent to the use of telemedicine in the course of my care at any time, without affecting my right to future care or treatment, and that the Practitioner or I may terminate the telemedicine visit at any time. I understand that I have the right to inspect all information obtained and/or recorded in the course of the telemedicine visit and may receive copies of available information for a reasonable fee.  I understand that some of the potential risks of receiving the Services via telemedicine include:  Delay or interruption in medical evaluation due to technological equipment failure or disruption; Information transmitted may not be sufficient (e.g. poor resolution  of images) to allow for appropriate medical decision making by the Practitioner; and/or  In rare instances, security protocols could fail, causing a breach of personal health information.  Furthermore, I acknowledge that it is my responsibility to provide information about my medical history, conditions and care that is complete and accurate to the best of my ability. I acknowledge that Practitioner's advice, recommendations, and/or decision may be based on factors not within their control, such as incomplete or inaccurate data provided by me or distortions of diagnostic images or specimens that may result from electronic transmissions. I understand that the practice of medicine is not an exact science and that Practitioner makes no warranties or guarantees regarding treatment outcomes. I acknowledge that a copy of this consent can be made available to me via my patient portal [facility] MyChart), or I can request a printed copy by calling the office of [facility].    I understand that my insurance will be billed for this visit.   I have read or had this consent read to me. I understand the contents of this consent, which adequately explains the benefits and risks of the Services being provided via telemedicine.  I have been provided ample opportunity to ask questions regarding this consent and the Services and have had my questions answered to my satisfaction. I give my informed consent for the services to be provided through the use of telemedicine in my medical care

## 2023-06-24 NOTE — Telephone Encounter (Signed)
 Patient has been scheduled for tele preop appt

## 2023-06-29 DIAGNOSIS — M25661 Stiffness of right knee, not elsewhere classified: Secondary | ICD-10-CM | POA: Diagnosis not present

## 2023-06-29 DIAGNOSIS — R262 Difficulty in walking, not elsewhere classified: Secondary | ICD-10-CM | POA: Diagnosis not present

## 2023-06-29 DIAGNOSIS — M6281 Muscle weakness (generalized): Secondary | ICD-10-CM | POA: Diagnosis not present

## 2023-06-29 DIAGNOSIS — M1711 Unilateral primary osteoarthritis, right knee: Secondary | ICD-10-CM | POA: Diagnosis not present

## 2023-07-06 DIAGNOSIS — M1711 Unilateral primary osteoarthritis, right knee: Secondary | ICD-10-CM | POA: Diagnosis not present

## 2023-07-06 DIAGNOSIS — M6281 Muscle weakness (generalized): Secondary | ICD-10-CM | POA: Diagnosis not present

## 2023-07-06 DIAGNOSIS — R262 Difficulty in walking, not elsewhere classified: Secondary | ICD-10-CM | POA: Diagnosis not present

## 2023-07-06 DIAGNOSIS — M25661 Stiffness of right knee, not elsewhere classified: Secondary | ICD-10-CM | POA: Diagnosis not present

## 2023-07-08 DIAGNOSIS — R262 Difficulty in walking, not elsewhere classified: Secondary | ICD-10-CM | POA: Diagnosis not present

## 2023-07-08 DIAGNOSIS — M1711 Unilateral primary osteoarthritis, right knee: Secondary | ICD-10-CM | POA: Diagnosis not present

## 2023-07-08 DIAGNOSIS — M25661 Stiffness of right knee, not elsewhere classified: Secondary | ICD-10-CM | POA: Diagnosis not present

## 2023-07-08 DIAGNOSIS — M6281 Muscle weakness (generalized): Secondary | ICD-10-CM | POA: Diagnosis not present

## 2023-07-12 DIAGNOSIS — M25661 Stiffness of right knee, not elsewhere classified: Secondary | ICD-10-CM | POA: Diagnosis not present

## 2023-07-12 DIAGNOSIS — M1711 Unilateral primary osteoarthritis, right knee: Secondary | ICD-10-CM | POA: Diagnosis not present

## 2023-07-12 DIAGNOSIS — R262 Difficulty in walking, not elsewhere classified: Secondary | ICD-10-CM | POA: Diagnosis not present

## 2023-07-12 DIAGNOSIS — M6281 Muscle weakness (generalized): Secondary | ICD-10-CM | POA: Diagnosis not present

## 2023-07-14 DIAGNOSIS — D696 Thrombocytopenia, unspecified: Secondary | ICD-10-CM | POA: Diagnosis not present

## 2023-07-14 DIAGNOSIS — R7303 Prediabetes: Secondary | ICD-10-CM | POA: Diagnosis not present

## 2023-07-14 DIAGNOSIS — E7800 Pure hypercholesterolemia, unspecified: Secondary | ICD-10-CM | POA: Diagnosis not present

## 2023-07-14 DIAGNOSIS — Z79899 Other long term (current) drug therapy: Secondary | ICD-10-CM | POA: Diagnosis not present

## 2023-07-19 ENCOUNTER — Encounter: Payer: Self-pay | Admitting: Pharmacist Clinician (PhC)/ Clinical Pharmacy Specialist

## 2023-07-19 ENCOUNTER — Ambulatory Visit: Attending: Cardiovascular Disease | Admitting: Pharmacist Clinician (PhC)/ Clinical Pharmacy Specialist

## 2023-07-19 ENCOUNTER — Ambulatory Visit

## 2023-07-19 DIAGNOSIS — Z01810 Encounter for preprocedural cardiovascular examination: Secondary | ICD-10-CM | POA: Diagnosis not present

## 2023-07-19 DIAGNOSIS — E782 Mixed hyperlipidemia: Secondary | ICD-10-CM

## 2023-07-19 NOTE — Patient Instructions (Addendum)
 Your Results:             Your most recent labs (2024) Goal  Total Cholesterol 164 < 200  Triglycerides 60 < 150  HDL (happy/good cholesterol) 60 > 40  LDL (lousy/bad cholesterol 99 < 70   Medication changes:  Continue with the rosuvastatin  40 mg daily for now.  We can consider adding ezetimbie (lowers LDL ~15%) or Repatha (injection, lowers LDL ~ 50%) once we know what the results are  Lab orders:  Go to the lab at your earliest convenience.  You will need to be fasting for this lab.     Thank you for choosing [facility]

## 2023-07-19 NOTE — Progress Notes (Signed)
 Office Visit    Patient Name: Allen Sanchez Date of Encounter: 07/19/2023  Primary Care Provider:  Regino Slater, MD Primary Cardiologist:  Dorn Lesches, MD  Chief Complaint    Hyperlipidemia   Significant Past Medical History   CAD 10/22 CAC = 163 (62 nd percentile)     Allergies  Allergen Reactions   Crestor  [Rosuvastatin ] Other (See Comments)    muscle weakness    Lipitor [Atorvastatin] Other (See Comments)    muscle weakness    Pravastatin Other (See Comments)    myalgias   Zetia [Ezetimibe] Other (See Comments)    muscle weakness     History of Present Illness    Allen Sanchez is a 78 y.o. male patient of Dr Lesches, in the office today to discuss options for cholesterol management.  Insurance Carrier: [facility] Medicare [phone] (no rx benefit); was in [employer] 22 yrs, retired - is Financial planner  LDL Cholesterol goal:  LDL < 70  Current Medications:  rosuvastatin     Family Hx:   father had stroke, otherwise strong family history of hypertension (siblings); mother died aneurysm; 2 sons, older has heart issues, pt unsure exactly what   Social Hx: Tobacco: no Alcohol : 1 glass of wine on Saturdays and 2 - 2oz drinks on Sundays  Diet:  take out mostly, fast food only occasionally at breakfast; tenderloin or ham biscuit; usually cereal and banana; getting vegetables most days, variety of meats. Occasional beans    Exercise: just recent right knee replaced, left is waiting to be scheduled; will go to gym afterward   Accessory Clinical Findings   In [facility]  07/2022  TC 164, TG 60, HDL 60, LDL 92  No results found for: LIPOA  Lab Results  Component Value Date   ALT 23 04/27/2023   AST 23 04/27/2023   ALKPHOS 68 04/27/2023   BILITOT 0.6 04/27/2023   Lab Results  Component Value Date   CREATININE 0.62 05/11/2023   BUN 20 05/11/2023   NA 135 05/11/2023   K 4.3 05/11/2023   CL 108 05/11/2023   CO2 20 (L) 05/11/2023   Lab Results  Component Value  Date   HGBA1C 5.6 04/27/2023    Home Medications    Current Outpatient Medications  Medication Sig Dispense Refill   brimonidine  (ALPHAGAN ) 0.2 % ophthalmic solution Place 1 drop into both eyes in the morning and at bedtime.     CIALIS 20 MG tablet Take 20 mg by mouth daily as needed for erectile dysfunction.  5   dorzolamide -timolol  (COSOPT ) 2-0.5 % ophthalmic solution Place 1 drop into both eyes 2 (two) times daily.     hydrocortisone  2.5 % cream Apply 1 Application topically daily as needed (eczema).     latanoprost  (XALATAN ) 0.005 % ophthalmic solution Place 1 drop into both eyes at bedtime.     Multiple Vitamin (MULTI VITAMIN DAILY) TABS Take 1 tablet by mouth daily.     Omega-3 Fatty Acids (FISH OIL) 1200 MG CAPS Take 1,200 mg by mouth daily.     rosuvastatin  (CRESTOR ) 40 MG tablet TAKE 1 TABLET(40 MG) BY MOUTH DAILY 90 tablet 3   triamcinolone  cream (KENALOG) 0.1 % Apply 1 Application topically 2 (two) times daily as needed (eczema).     omeprazole  (PRILOSEC) 40 MG capsule Take 1 capsule (40 mg total) by mouth daily for 21 days. (Patient not taking: Reported on 07/19/2023) 21 capsule 0   polyethylene glycol (MIRALAX ) 17 g packet Take 17  g by mouth daily. (Patient not taking: Reported on 07/19/2023) 14 each 0   No current facility-administered medications for this visit.     Assessment & Plan    Hyperlipidemia Assessment: Patient with ASCVD not at LDL goal of < 70 Most recent LDL 92 on 07/2022 Has been compliant with high intensity statin : rosuvastatin  40 mg   Reviewed options for lowering LDL cholesterol, including ezetimibe, PCSK-9 inhibitors and inclisiran.  Discussed mechanisms of action, dosing, side effects, potential decreases in LDL cholesterol and costs.  Also reviewed potential options for patient assistance.  Plan: Patient will get updated labs in the next week.  Once reviewed, will reach out to patient to determine what best option is for further LDL lowering.   Can  look to [facility] for pricing should we go to Repatha - pt [facility]   Allean Mink, PharmD CPP [facility] [address]   [place], [place] [address] [phone]  07/19/2023, 4:50 PM

## 2023-07-19 NOTE — Assessment & Plan Note (Signed)
 Assessment: Patient with ASCVD not at LDL goal of < 70 Most recent LDL 92 on 07/2022 Has been compliant with high intensity statin : rosuvastatin  40 mg   Reviewed options for lowering LDL cholesterol, including ezetimibe, PCSK-9 inhibitors and inclisiran.  Discussed mechanisms of action, dosing, side effects, potential decreases in LDL cholesterol and costs.  Also reviewed potential options for patient assistance.  Plan: Patient will get updated labs in the next week.  Once reviewed, will reach out to patient to determine what best option is for further LDL lowering.   Can look to [place] for pricing should we go to Repatha - pt [employer]

## 2023-07-19 NOTE — Progress Notes (Signed)
 Virtual Visit via Telephone Note   Because of Macen Joslin Fouch co-morbid illnesses, he is at least at moderate risk for complications without adequate follow up.  This format is felt to be most appropriate for this patient at this time.  Due to technical limitations with video connection (technology), today's appointment will be conducted as an audio only telehealth visit, and Allen Sanchez verbally agreed to proceed in this manner.   All issues noted in this document were discussed and addressed.  No physical exam could be performed with this format.  Evaluation Performed:  Preoperative cardiovascular risk assessment _____________   Date:  07/19/2023   Patient ID:  Allen Sanchez, DOB 04-08-1945, MRN [id number] Patient Location:  Home Provider location:   Office  Primary Care Provider:  Regino Slater, MD Primary Cardiologist:  Dorn Lesches, MD  Chief Complaint / Patient Profile  78 y.o. y/o male with a h/o aortic stenosis, hyperlipidemia, elevated coronary artery calcium  score who is pending left total knee arthroplasty and presents today for telephonic preoperative cardiovascular risk assessment. History of Present Illness  Allen Sanchez is a 78 y.o. male who presents via audio/video conferencing for a telehealth visit today.  Pt was last seen in cardiology clinic on 03/12/2023 by Dr. Lesches.  At that time Allen Sanchez was doing well.  The patient is now pending procedure as outlined above. Since his last visit, he has remained stable from a cardiac standpoint. Today he denies chest pain, shortness of breath, lower extremity edema, fatigue, palpitations, melena, hematuria, hemoptysis, diaphoresis, weakness, presyncope, syncope, orthopnea, and PND.  He is able to achieve greater than 4 METS of activity. Past Medical History    Past Medical History:  Diagnosis Date   Aortic stenosis    severe in 03/2023   Arthritis    knee   Diabetes mellitus without complication (HCC)     Eczema    ED (erectile dysfunction)    GERD (gastroesophageal reflux disease)    Horseshoe kidney    Hyperlipidemia    Neuromuscular disorder (HCC)    Sciatic nerve pain greater left -under physical therapy now   PAC (premature atrial contraction)    Past Surgical History:  Procedure Laterality Date   COLONOSCOPY WITH PROPOFOL  N/A 05/29/2014   Procedure: COLONOSCOPY WITH PROPOFOL ;  Surgeon: Gladis MARLA Louder, MD;  Location: [facility];  Service: Endoscopy;  Laterality: N/A;   TOTAL KNEE ARTHROPLASTY Right 05/10/2023   Procedure: ARTHROPLASTY, KNEE, TOTAL;  Surgeon: Edna Toribio LABOR, MD;  Location: [facility];  Service: Orthopedics;  Laterality: Right;   Allergies Allergies  Allergen Reactions   Crestor  [Rosuvastatin ] Other (See Comments)    muscle weakness    Lipitor [Atorvastatin] Other (See Comments)    muscle weakness    Pravastatin Other (See Comments)    myalgias   Zetia [Ezetimibe] Other (See Comments)    muscle weakness    Home Medications    Prior to Admission medications   Medication Sig Start Date End Date Taking? Authorizing Provider  brimonidine  (ALPHAGAN ) 0.2 % ophthalmic solution Place 1 drop into both eyes in the morning and at bedtime. 10/17/21   [provider]  CIALIS 20 MG tablet Take 20 mg by mouth daily as needed for erectile dysfunction. 08/25/13   [provider]  dorzolamide -timolol  (COSOPT ) 2-0.5 % ophthalmic solution Place 1 drop into both eyes 2 (two) times daily. 02/11/23   [provider]  hydrocortisone  2.5 % cream Apply 1 Application topically daily as  needed (eczema). 03/03/22   [provider]  latanoprost  (XALATAN ) 0.005 % ophthalmic solution Place 1 drop into both eyes at bedtime. 09/02/21   [provider]  Multiple Vitamin (MULTI VITAMIN DAILY) TABS Take 1 tablet by mouth daily.    [provider]  Omega-3 Fatty Acids (FISH OIL) 1200 MG CAPS Take 1,200 mg by mouth daily.    [provider]  omeprazole  (PRILOSEC) 40 MG capsule Take 1 capsule (40 mg total) by mouth daily for 21 days. 05/10/23 05/31/23  Renae Bernarda HERO, PA-C  polyethylene glycol (MIRALAX ) 17 g packet Take 17 g by mouth daily. 05/10/23   Cockerham, Alicia M, PA-C  rosuvastatin  (CRESTOR ) 40 MG tablet TAKE 1 TABLET(40 MG) BY MOUTH DAILY 11/03/22   Court Dorn PARAS, MD  triamcinolone  cream (KENALOG) 0.1 % Apply 1 Application topically 2 (two) times daily as needed (eczema). 03/18/23   [provider]    Physical Exam  Vital Signs:  Allen Sanchez does not have vital signs available for review today. Given telephonic nature of communication, physical exam is limited. AAOx3. NAD. Normal affect.  Speech and respirations are unlabored. Accessory Clinical Findings  None Assessment & Plan    1.  Preoperative Cardiovascular Risk Assessment:  Mr. Brancato perioperative risk of a major cardiac event is 0.4% according to the Revised Cardiac Risk Index (RCRI). Per Dr. Court patient is at moderate risk for procedures given severe aortic stenosis, patient remains asymptomatic.  His functional capacity is good at 5.07 METs according to the [facility] Activity Status Index (DASI). Recommendations: According to ACC/AHA guidelines, no further cardiovascular testing needed.  The patient may proceed to surgery at acceptable risk.   Antiplatelet and/or Anticoagulation Recommendations: None requested.    The patient was advised that if he develops new symptoms prior to surgery to contact our office to arrange for a follow-up visit, and he verbalized understanding.  A copy of this note will be routed to requesting surgeon.  Time:   Today, I have spent 10 minutes with the patient with telehealth technology discussing medical history, symptoms, and management plan.    Tashara Suder D Karsten Vaughn, NP  07/19/2023, 11:43 AM

## 2023-08-04 NOTE — Progress Notes (Signed)
 Sent message, via epic in basket, requesting orders in epic from Careers adviser.

## 2023-08-05 DIAGNOSIS — M25562 Pain in left knee: Secondary | ICD-10-CM | POA: Diagnosis not present

## 2023-08-05 DIAGNOSIS — E7800 Pure hypercholesterolemia, unspecified: Secondary | ICD-10-CM | POA: Diagnosis not present

## 2023-08-05 DIAGNOSIS — I350 Nonrheumatic aortic (valve) stenosis: Secondary | ICD-10-CM | POA: Diagnosis not present

## 2023-08-05 DIAGNOSIS — M1711 Unilateral primary osteoarthritis, right knee: Secondary | ICD-10-CM | POA: Diagnosis not present

## 2023-08-05 DIAGNOSIS — E119 Type 2 diabetes mellitus without complications: Secondary | ICD-10-CM | POA: Diagnosis not present

## 2023-08-09 NOTE — Patient Instructions (Signed)
 SURGICAL WAITING ROOM VISITATION  Patients having surgery or a procedure may have no more than 2 support people in the waiting area - these visitors may rotate.    Children under the age of 10 must have an adult with them who is not the patient.  Visitors with respiratory illnesses are discouraged from visiting and should remain at home.  If the patient needs to stay at the hospital during part of their recovery, the visitor guidelines for inpatient rooms apply. Pre-op nurse will coordinate an appropriate time for 1 support person to accompany patient in pre-op.  This support person may not rotate.    Please refer to the [facility] website for the visitor guidelines for Inpatients (after your surgery is over and you are in a regular room).       Your procedure is scheduled on: 08/23/23   Report to [facility] Main Entrance    Report to admitting at: 5:15 AM   Call this number if you have problems the morning of surgery [phone]   Do not eat food :After Midnight.   After Midnight you may have the following liquids until : 4:30 AM DAY OF SURGERY  Water  Non-Citrus Juices (without pulp, NO RED-Apple, White grape, White cranberry) Black Coffee (NO MILK/CREAM OR CREAMERS, sugar ok)  Clear Tea (NO MILK/CREAM OR CREAMERS, sugar ok) regular and decaf                             Plain Jell-O (NO RED)                                           Fruit ices (not with fruit pulp, NO RED)                                     Popsicles (NO RED)                                                               Sports drinks like Gatorade (NO RED)   The day of surgery:  Drink ONE (1) Pre-Surgery Clear G2 at : 5:15 AM the morning of surgery. Drink in one sitting. Do not sip.  This drink was given to you during your hospital  pre-op appointment visit. Nothing else to drink after completing the  Pre-Surgery Clear Ensure or G2.          If you have questions, please contact your surgeon's  office.  FOLLOW ANY ADDITIONAL PRE OP INSTRUCTIONS YOU RECEIVED FROM YOUR SURGEON'S OFFICE!!!   Oral Hygiene is also important to reduce your risk of infection.                                    Remember - BRUSH YOUR TEETH THE MORNING OF SURGERY WITH YOUR REGULAR TOOTHPASTE  DENTURES WILL BE REMOVED PRIOR TO SURGERY PLEASE DO NOT APPLY Poly grip OR ADHESIVES!!!   Do NOT smoke after Midnight   Stop all vitamins  and herbal supplements 7 days before surgery.   Take these medicines the morning of surgery with A SIP OF WATER :NONE. Use eye drops as usual. Tylenol  as needed.   DO NOT TAKE ANY ORAL DIABETIC MEDICATIONS DAY OF YOUR SURGERY  Bring CPAP mask and tubing day of surgery.                              You may not have any metal on your body including hair pins, jewelry, and body piercing             Do not wear  lotions, powders, perfumes/cologne, or deodorant              Men may shave face and neck.   Do not bring valuables to the hospital. [facility] IS NOT             RESPONSIBLE   FOR VALUABLES.   Contacts, glasses, dentures or bridgework may not be worn into surgery.   Bring small overnight bag day of surgery.   DO NOT BRING YOUR HOME MEDICATIONS TO THE HOSPITAL. PHARMACY WILL DISPENSE MEDICATIONS LISTED ON YOUR MEDICATION LIST TO YOU DURING YOUR ADMISSION IN THE HOSPITAL!    Patients discharged on the day of surgery will not be allowed to drive home.  Someone NEEDS to stay with you for the first 24 hours after anesthesia.   Special Instructions: Bring a copy of your healthcare power of attorney and living will documents the day of surgery if you haven't scanned them before.              Please read over the following fact sheets you were given: IF YOU HAVE QUESTIONS ABOUT YOUR PRE-OP INSTRUCTIONS PLEASE CALL [phone].   If you received a COVID test during your pre-op visit  it is requested that you wear a mask when out in public, stay away from anyone that may  not be feeling well and notify your surgeon if you develop symptoms. If you test positive for Covid or have been in contact with anyone that has tested positive in the last 10 days please notify you surgeon.      Pre-operative 5 CHG Bath Instructions   You can play a key role in reducing the risk of infection after surgery. Your skin needs to be as free of germs as possible. You can reduce the number of germs on your skin by washing with CHG (chlorhexidine  gluconate) soap before surgery. CHG is an antiseptic soap that kills germs and continues to kill germs even after washing.   DO NOT use if you have an allergy to chlorhexidine /CHG or antibacterial soaps. If your skin becomes reddened or irritated, stop using the CHG and notify one of our RNs at [phone].   Please shower with the CHG soap starting 4 days before surgery using the following schedule:     Please keep in mind the following:  DO NOT shave, including legs and underarms, starting the day of your first shower.   You may shave your face at any point before/day of surgery.  Place clean sheets on your bed the day you start using CHG soap. Use a clean washcloth (not used since being washed) for each shower. DO NOT sleep with pets once you start using the CHG.   CHG Shower Instructions:  If you choose to wash your hair and private area, wash first with your normal shampoo/soap.  After  you use shampoo/soap, rinse your hair and body thoroughly to remove shampoo/soap residue.  Turn the water  OFF and apply about 3 tablespoons (45 ml) of CHG soap to a CLEAN washcloth.  Apply CHG soap ONLY FROM YOUR NECK DOWN TO YOUR TOES (washing for 3-5 minutes)  DO NOT use CHG soap on face, private areas, open wounds, or sores.  Pay special attention to the area where your surgery is being performed.  If you are having back surgery, having someone wash your back for you may be helpful. Wait 2 minutes after CHG soap is applied, then you may rinse  off the CHG soap.  Pat dry with a clean towel  Put on clean clothes/pajamas   If you choose to wear lotion, please use ONLY the CHG-compatible lotions on the back of this paper.     Additional instructions for the day of surgery: DO NOT APPLY any lotions, deodorants, cologne, or perfumes.   Put on clean/comfortable clothes.  Brush your teeth.  Ask your nurse before applying any prescription medications to the skin.   CHG Compatible Lotions   Aveeno Moisturizing lotion  Cetaphil Moisturizing Cream  Cetaphil Moisturizing Lotion  Clairol Herbal Essence Moisturizing Lotion, Dry Skin  Clairol Herbal Essence Moisturizing Lotion, Extra Dry Skin  Clairol Herbal Essence Moisturizing Lotion, Normal Skin  Curel Age Defying Therapeutic Moisturizing Lotion with Alpha Hydroxy  Curel Extreme Care Body Lotion  Curel Soothing Hands Moisturizing Hand Lotion  Curel Therapeutic Moisturizing Cream, Fragrance-Free  Curel Therapeutic Moisturizing Lotion, Fragrance-Free  Curel Therapeutic Moisturizing Lotion, Original Formula  Eucerin Daily Replenishing Lotion  Eucerin Dry Skin Therapy Plus Alpha Hydroxy Crme  Eucerin Dry Skin Therapy Plus Alpha Hydroxy Lotion  Eucerin Original Crme  Eucerin Original Lotion  Eucerin Plus Crme Eucerin Plus Lotion  Eucerin TriLipid Replenishing Lotion  Keri Anti-Bacterial Hand Lotion  Keri Deep Conditioning Original Lotion Dry Skin Formula Softly Scented  Keri Deep Conditioning Original Lotion, Fragrance Free Sensitive Skin Formula  Keri Lotion Fast Absorbing Fragrance Free Sensitive Skin Formula  Keri Lotion Fast Absorbing Softly Scented Dry Skin Formula  Keri Original Lotion  Keri Skin Renewal Lotion Keri Silky Smooth Lotion  Keri Silky Smooth Sensitive Skin Lotion  Nivea Body Creamy Conditioning Oil  Nivea Body Extra Enriched Lotion  Nivea Body Original Lotion  Nivea Body Sheer Moisturizing Lotion Nivea Crme  Nivea Skin Firming Lotion  NutraDerm 30 Skin  Lotion  NutraDerm Skin Lotion  NutraDerm Therapeutic Skin Cream  NutraDerm Therapeutic Skin Lotion  ProShield Protective Hand Cream  Provon moisturizing lotion   Incentive Spirometer  An incentive spirometer is a tool that can help keep your lungs clear and active. This tool measures how well you are filling your lungs with each breath. Taking long deep breaths may help reverse or decrease the chance of developing breathing (pulmonary) problems (especially infection) following: A long period of time when you are unable to move or be active. BEFORE THE PROCEDURE  If the spirometer includes an indicator to show your best effort, your nurse or respiratory therapist will set it to a desired goal. If possible, sit up straight or lean slightly forward. Try not to slouch. Hold the incentive spirometer in an upright position. INSTRUCTIONS FOR USE  Sit on the edge of your bed if possible, or sit up as far as you can in bed or on a chair. Hold the incentive spirometer in an upright position. Breathe out normally. Place the mouthpiece in your mouth and seal your lips tightly  around it. Breathe in slowly and as deeply as possible, raising the piston or the ball toward the top of the column. Hold your breath for 3-5 seconds or for as long as possible. Allow the piston or ball to fall to the bottom of the column. Remove the mouthpiece from your mouth and breathe out normally. Rest for a few seconds and repeat Steps 1 through 7 at least 10 times every 1-2 hours when you are awake. Take your time and take a few normal breaths between deep breaths. The spirometer may include an indicator to show your best effort. Use the indicator as a goal to work toward during each repetition. After each set of 10 deep breaths, practice coughing to be sure your lungs are clear. If you have an incision (the cut made at the time of surgery), support your incision when coughing by placing a pillow or rolled up towels firmly  against it. Once you are able to get out of bed, walk around indoors and cough well. You may stop using the incentive spirometer when instructed by your caregiver.  RISKS AND COMPLICATIONS Take your time so you do not get dizzy or light-headed. If you are in pain, you may need to take or ask for pain medication before doing incentive spirometry. It is harder to take a deep breath if you are having pain. AFTER USE Rest and breathe slowly and easily. It can be helpful to keep track of a log of your progress. Your caregiver can provide you with a simple table to help with this. If you are using the spirometer at home, follow these instructions: SEEK MEDICAL CARE IF:  You are having difficultly using the spirometer. You have trouble using the spirometer as often as instructed. Your pain medication is not giving enough relief while using the spirometer. You develop fever of 100.5 F (38.1 C) or higher. SEEK IMMEDIATE MEDICAL CARE IF:  You cough up bloody sputum that had not been present before. You develop fever of 102 F (38.9 C) or greater. You develop worsening pain at or near the incision site. MAKE SURE YOU:  Understand these instructions. Will watch your condition. Will get help right away if you are not doing well or get worse. Document Released: 05/04/2006 Document Revised: 03/16/2011 Document Reviewed: 07/05/2006 [facility] Patient Information 2014 [place], [place].   ________________________________________________________________________

## 2023-08-11 ENCOUNTER — Ambulatory Visit: Payer: Self-pay | Admitting: Emergency Medicine

## 2023-08-11 ENCOUNTER — Encounter (HOSPITAL_COMMUNITY)
Admission: RE | Admit: 2023-08-11 | Discharge: 2023-08-11 | Disposition: A | Discharge status: Home / Self Care | Source: Ambulatory Visit | Attending: Orthopedic Surgery | Admitting: Orthopedic Surgery

## 2023-08-11 ENCOUNTER — Other Ambulatory Visit: Payer: Self-pay

## 2023-08-11 ENCOUNTER — Encounter (HOSPITAL_COMMUNITY): Payer: Self-pay

## 2023-08-11 VITALS — BP 150/82 | HR 74 | Temp 98.0°F | Ht 63.0 in | Wt 180.0 lb

## 2023-08-11 DIAGNOSIS — M25562 Pain in left knee: Secondary | ICD-10-CM | POA: Diagnosis not present

## 2023-08-11 DIAGNOSIS — E119 Type 2 diabetes mellitus without complications: Secondary | ICD-10-CM | POA: Insufficient documentation

## 2023-08-11 DIAGNOSIS — G8929 Other chronic pain: Secondary | ICD-10-CM | POA: Diagnosis not present

## 2023-08-11 DIAGNOSIS — M1712 Unilateral primary osteoarthritis, left knee: Secondary | ICD-10-CM | POA: Insufficient documentation

## 2023-08-11 DIAGNOSIS — Z01818 Encounter for other preprocedural examination: Secondary | ICD-10-CM

## 2023-08-11 DIAGNOSIS — I080 Rheumatic disorders of both mitral and aortic valves: Secondary | ICD-10-CM | POA: Diagnosis not present

## 2023-08-11 DIAGNOSIS — Z01812 Encounter for preprocedural laboratory examination: Secondary | ICD-10-CM | POA: Insufficient documentation

## 2023-08-11 DIAGNOSIS — I10 Essential (primary) hypertension: Secondary | ICD-10-CM | POA: Diagnosis not present

## 2023-08-11 HISTORY — DX: Cardiac arrhythmia, unspecified: I49.9

## 2023-08-11 LAB — CBC WITH DIFFERENTIAL/PLATELET
Abs Immature Granulocytes: 0.02 K/uL (ref 0.00–0.07)
Basophils Absolute: 0 K/uL (ref 0.0–0.1)
Basophils Relative: 1 %
Eosinophils Absolute: 0.1 K/uL (ref 0.0–0.5)
Eosinophils Relative: 2 %
HCT: 41.6 % (ref 39.0–52.0)
Hemoglobin: 12.7 g/dL — ABNORMAL LOW (ref 13.0–17.0)
Immature Granulocytes: 1 %
Lymphocytes Relative: 24 %
Lymphs Abs: 1 K/uL (ref 0.7–4.0)
MCH: 27.5 pg (ref 26.0–34.0)
MCHC: 30.5 g/dL (ref 30.0–36.0)
MCV: 90.2 fL (ref 80.0–100.0)
Monocytes Absolute: 0.5 K/uL (ref 0.1–1.0)
Monocytes Relative: 13 %
Neutro Abs: 2.5 K/uL (ref 1.7–7.7)
Neutrophils Relative %: 59 %
Platelets: 120 K/uL — ABNORMAL LOW (ref 150–400)
RBC: 4.61 MIL/uL (ref 4.22–5.81)
RDW: 14.7 % (ref 11.5–15.5)
WBC: 4.2 K/uL (ref 4.0–10.5)
nRBC: 0 % (ref 0.0–0.2)

## 2023-08-11 LAB — COMPREHENSIVE METABOLIC PANEL WITH GFR
ALT: 16 U/L (ref 0–44)
AST: 25 U/L (ref 15–41)
Albumin: 4 g/dL (ref 3.5–5.0)
Alkaline Phosphatase: 76 U/L (ref 38–126)
Anion gap: 10 (ref 5–15)
BUN: 19 mg/dL (ref 8–23)
CO2: 23 mmol/L (ref 22–32)
Calcium: 9.9 mg/dL (ref 8.9–10.3)
Chloride: 107 mmol/L (ref 98–111)
Creatinine, Ser: 1.01 mg/dL (ref 0.61–1.24)
GFR, Estimated: >60 mL/min (ref >60)
Glucose, Bld: 97 mg/dL (ref 70–99)
Potassium: 4.4 mmol/L (ref 3.5–5.1)
Sodium: 140 mmol/L (ref 135–145)
Total Bilirubin: 0.7 mg/dL (ref 0.0–1.2)
Total Protein: 7.1 g/dL (ref 6.5–8.1)

## 2023-08-11 LAB — SURGICAL PCR SCREEN
MRSA, PCR: NEGATIVE
Staphylococcus aureus: NEGATIVE

## 2023-08-11 LAB — TYPE AND SCREEN
ABO/RH(D): O POS
Antibody Screen: NEGATIVE

## 2023-08-11 LAB — GLUCOSE, CAPILLARY: Glucose-Capillary: 101 mg/dL — ABNORMAL HIGH (ref 70–99)

## 2023-08-11 NOTE — H&P (View-Only) (Signed)
 TOTAL KNEE ADMISSION H&P  Patient is being admitted for left total knee arthroplasty.  Subjective:  Chief Complaint:left knee pain.  HPI: Allen Sanchez, 78 y.o. male, has a history of pain and functional disability in the left knee due to arthritis and has failed non-surgical conservative treatments for greater than 12 weeks to includeNSAID's and/or analgesics, corticosteriod injections, viscosupplementation injections, use of assistive devices, and activity modification.  Onset of symptoms was gradual, starting 4 years ago with gradually worsening course since that time. The patient noted no past surgery on the left knee(s).  Patient currently rates pain in the left knee(s) at 6 out of 10 with activity. Patient has night pain, worsening of pain with activity and weight bearing, pain that interferes with activities of daily living, and pain with passive range of motion.  Patient has evidence of periarticular osteophytes and joint space narrowing by imaging studies.  There is no active infection.  Patient Active Problem List   Diagnosis Date Noted   Primary osteoarthritis of right knee 05/10/2023   Preoperative clearance 03/12/2023   Elevated coronary artery calcium  score 10/21/2021   Moderate aortic stenosis 04/16/2017   Murmur, cardiac 11/13/2013   Overweight 11/13/2013   PAC (premature atrial contraction) 11/13/2013   Hyperlipidemia 11/13/2013   Erectile dysfunction 02/23/2011   Past Medical History:  Diagnosis Date   Aortic stenosis    severe in 03/2023   Arthritis    knee   Diabetes mellitus without complication (HCC)    Eczema    ED (erectile dysfunction)    GERD (gastroesophageal reflux disease)    Horseshoe kidney    Hyperlipidemia    Neuromuscular disorder (HCC)    Sciatic nerve pain greater left -under physical therapy now   PAC (premature atrial contraction)     Past Surgical History:  Procedure Laterality Date   COLONOSCOPY WITH PROPOFOL  N/A 05/29/2014   Procedure:  COLONOSCOPY WITH PROPOFOL ;  Surgeon: Gladis MARLA Louder, MD;  Location: [facility];  Service: Endoscopy;  Laterality: N/A;   TOTAL KNEE ARTHROPLASTY Right 05/10/2023   Procedure: ARTHROPLASTY, KNEE, TOTAL;  Surgeon: Edna Toribio LABOR, MD;  Location: [facility];  Service: Orthopedics;  Laterality: Right;    Current Outpatient Medications  Medication Sig Dispense Refill Last Dose/Taking   acetaminophen  (TYLENOL ) 650 MG CR tablet Take 1,300 mg by mouth in the morning and at bedtime.      bisacodyl (DULCOLAX) 5 MG EC tablet Take 5-10 mg by mouth daily as needed (constipation.).      brimonidine  (ALPHAGAN ) 0.2 % ophthalmic solution Place 1 drop into both eyes in the morning and at bedtime.      calcium  carbonate (TUMS - DOSED IN MG ELEMENTAL CALCIUM ) 500 MG chewable tablet Chew 1-2 tablets by mouth 3 (three) times daily as needed for indigestion or heartburn.      CIALIS 20 MG tablet Take 20 mg by mouth daily as needed for erectile dysfunction.  5    diclofenac (VOLTAREN) 75 MG EC tablet Take 75 mg by mouth 2 (two) times daily as needed (pain.).      dorzolamide -timolol  (COSOPT ) 2-0.5 % ophthalmic solution Place 1 drop into both eyes 2 (two) times daily.      hydrocortisone  2.5 % cream Apply 1 Application topically daily as needed (eczema).      latanoprost  (XALATAN ) 0.005 % ophthalmic solution Place 1 drop into both eyes at bedtime.      Multiple Vitamin (MULTIVITAMIN WITH MINERALS) TABS tablet Take 1 tablet by mouth in  the morning. Centrum Silver      Omega-3 Fatty Acids (FISH OIL PO) Take 1,000 mg by mouth in the morning.      primidone (MYSOLINE) 50 MG tablet Take 50 mg by mouth daily at 6 PM.      rosuvastatin  (CRESTOR ) 40 MG tablet TAKE 1 TABLET(40 MG) BY MOUTH DAILY 90 tablet 3    triamcinolone  cream (KENALOG) 0.1 % Apply 1 Application topically 2 (two) times daily as needed (eczema).      No current facility-administered medications for this visit.   Allergies  Allergen Reactions   Crestor   [Rosuvastatin ] Other (See Comments)    muscle weakness    Lipitor [Atorvastatin] Other (See Comments)    muscle weakness    Pravastatin Other (See Comments)    myalgias   Zetia [Ezetimibe] Other (See Comments)    muscle weakness     Social History   Tobacco Use   Smoking status: Never   Smokeless tobacco: Never  Substance Use Topics   Alcohol  use: Yes    Alcohol /week: 0.0 standard drinks of alcohol     Comment: 2 drinks per week(Sundays)    Family History  Problem Relation Age of Onset   Aneurysm Mother    Stroke Father      Review of Systems  Musculoskeletal:  Positive for arthralgias.  All other systems reviewed and are negative.   Objective:  Physical Exam Constitutional:      General: He is not in acute distress.    Appearance: Normal appearance. He is normal weight.  HENT:     Head: Normocephalic and atraumatic.  Eyes:     Extraocular Movements: Extraocular movements intact.     Conjunctiva/sclera: Conjunctivae normal.     Pupils: Pupils are equal, round, and reactive to light.  Cardiovascular:     Rate and Rhythm: Normal rate and regular rhythm.     Pulses: Normal pulses.     Heart sounds: Normal heart sounds.  Pulmonary:     Effort: Pulmonary effort is normal. No respiratory distress.     Breath sounds: Normal breath sounds.  Abdominal:     General: Bowel sounds are normal. There is no distension.     Palpations: Abdomen is soft.     Tenderness: There is no abdominal tenderness.  Musculoskeletal:        General: Tenderness present.     Cervical back: Normal range of motion and neck supple.     Comments: TTP over medial and lateral joint line, medial worse than lateral.  No calf tenderness, swelling, or erythema.  No overlying lesions of area of chief complaint.  Decreased strength and ROM due to elicited pain.  Pre-operative ROM 0-115.  Dorsiflexion and plantarflexion intact.  Stable to varus and valgus stress.  BLE appear grossly neurovascularly intact.   Gait mildly antalgic.   Lymphadenopathy:     Cervical: No cervical adenopathy.  Skin:    General: Skin is warm and dry.     Capillary Refill: Capillary refill takes less than 2 seconds.     Findings: No erythema or rash.  Neurological:     General: No focal deficit present.     Mental Status: He is alert and oriented to person, place, and time.  Psychiatric:        Mood and Affect: Mood normal.        Behavior: Behavior normal.     Vital signs in last 24 hours: @VSRANGES @  Labs:   Estimated body mass index is  30.33 kg/m as calculated from the following:   Height as of 05/10/23: 5' 3 (1.6 m).   Weight as of 05/10/23: 77.7 kg.   Imaging Review Plain radiographs demonstrate severe degenerative joint disease of the left knee(s). The overall alignment issignificant varus. The bone quality appears to be fair for age and reported activity level.     Assessment/Plan:  End stage arthritis, left knee   The patient history, physical examination, clinical judgment of the provider and imaging studies are consistent with end stage degenerative joint disease of the left knee(s) and total knee arthroplasty is deemed medically necessary. The treatment options including medical management, injection therapy arthroscopy and arthroplasty were discussed at length. The risks and benefits of total knee arthroplasty were presented and reviewed. The risks due to aseptic loosening, infection, stiffness, patella tracking problems, thromboembolic complications and other imponderables were discussed. The patient acknowledged the explanation, agreed to proceed with the plan and consent was signed. Patient is being admitted for inpatient treatment for surgery, pain control, PT, OT, prophylactic antibiotics, VTE prophylaxis, progressive ambulation and ADL's and discharge planning. The patient is planning to be discharged home with OPPT.    Anticipated LOS equal to or greater than 2 midnights due to - Age 23  and older with one or more of the following:  - Obesity  - Expected need for hospital services (PT, OT, Nursing) required for safe  discharge  - Anticipated need for postoperative skilled nursing care or inpatient rehab  - Active co-morbidities: prediabetes, aortic stenosis, erectile dysfunciton, thrombocytopenia, glaucoma, horseshoe kidney, GERD, HLD OR   - Unanticipated findings during/Post Surgery: None  - Patient is a high risk of re-admission due to: None

## 2023-08-11 NOTE — Progress Notes (Signed)
 For Anesthesia: PCP - Regino Slater, MD  Cardiologist - Court Dorn PARAS, MD  Clearance: Devora Prom D, NP  : 07/19/23 Bowel Prep reminder:  Chest x-ray - CT cardiac: 10/29/20 EKG - 03/12/23 Stress Test -  ECHO - 03/12/23 Cardiac Cath -  Pacemaker/ICD device last checked: Pacemaker orders received: Device Rep notified:  Spinal Cord Stimulator:N/A  Sleep Study - N/A CPAP -   Fasting Blood Sugar - N/A Checks Blood Sugar ___0__ times a day Date and result of last Hgb A1c-  Last dose of GLP1 agonist- N/A GLP1 instructions:   Last dose of SGLT-2 inhibitors- N/A SGLT-2 instructions:   Blood Thinner Instructions:N/A Aspirin  Instructions: Last Dose:  Activity level: Can go up a flight of stairs and activities of daily living without stopping and without chest pain and/or shortness of breath   Able to exercise without chest pain and/or shortness of breath  Anesthesia review: Hx: DIA,PAC,Aortic stenosis.  Patient denies shortness of breath, fever, cough and chest pain at PAT appointment   Patient verbalized understanding of instructions that were reviewed over the telephone.

## 2023-08-11 NOTE — H&P (Signed)
 TOTAL KNEE ADMISSION H&P  Patient is being admitted for left total knee arthroplasty.  Subjective:  Chief Complaint:left knee pain.  HPI: Allen Sanchez, 78 y.o. male, has a history of pain and functional disability in the left knee due to arthritis and has failed non-surgical conservative treatments for greater than 12 weeks to includeNSAID's and/or analgesics, corticosteriod injections, viscosupplementation injections, use of assistive devices, and activity modification.  Onset of symptoms was gradual, starting 4 years ago with gradually worsening course since that time. The patient noted no past surgery on the left knee(s).  Patient currently rates pain in the left knee(s) at 6 out of 10 with activity. Patient has night pain, worsening of pain with activity and weight bearing, pain that interferes with activities of daily living, and pain with passive range of motion.  Patient has evidence of periarticular osteophytes and joint space narrowing by imaging studies.  There is no active infection.  Patient Active Problem List   Diagnosis Date Noted   Primary osteoarthritis of right knee 05/10/2023   Preoperative clearance 03/12/2023   Elevated coronary artery calcium  score 10/21/2021   Moderate aortic stenosis 04/16/2017   Murmur, cardiac 11/13/2013   Overweight 11/13/2013   PAC (premature atrial contraction) 11/13/2013   Hyperlipidemia 11/13/2013   Erectile dysfunction 02/23/2011   Past Medical History:  Diagnosis Date   Aortic stenosis    severe in 03/2023   Arthritis    knee   Diabetes mellitus without complication (HCC)    Eczema    ED (erectile dysfunction)    GERD (gastroesophageal reflux disease)    Horseshoe kidney    Hyperlipidemia    Neuromuscular disorder (HCC)    Sciatic nerve pain greater left -under physical therapy now   PAC (premature atrial contraction)     Past Surgical History:  Procedure Laterality Date   COLONOSCOPY WITH PROPOFOL  N/A 05/29/2014   Procedure:  COLONOSCOPY WITH PROPOFOL ;  Surgeon: Gladis MARLA Louder, MD;  Location: [facility];  Service: Endoscopy;  Laterality: N/A;   TOTAL KNEE ARTHROPLASTY Right 05/10/2023   Procedure: ARTHROPLASTY, KNEE, TOTAL;  Surgeon: Edna Toribio LABOR, MD;  Location: [facility];  Service: Orthopedics;  Laterality: Right;    Current Outpatient Medications  Medication Sig Dispense Refill Last Dose/Taking   acetaminophen  (TYLENOL ) 650 MG CR tablet Take 1,300 mg by mouth in the morning and at bedtime.      bisacodyl (DULCOLAX) 5 MG EC tablet Take 5-10 mg by mouth daily as needed (constipation.).      brimonidine  (ALPHAGAN ) 0.2 % ophthalmic solution Place 1 drop into both eyes in the morning and at bedtime.      calcium  carbonate (TUMS - DOSED IN MG ELEMENTAL CALCIUM ) 500 MG chewable tablet Chew 1-2 tablets by mouth 3 (three) times daily as needed for indigestion or heartburn.      CIALIS 20 MG tablet Take 20 mg by mouth daily as needed for erectile dysfunction.  5    diclofenac (VOLTAREN) 75 MG EC tablet Take 75 mg by mouth 2 (two) times daily as needed (pain.).      dorzolamide -timolol  (COSOPT ) 2-0.5 % ophthalmic solution Place 1 drop into both eyes 2 (two) times daily.      hydrocortisone  2.5 % cream Apply 1 Application topically daily as needed (eczema).      latanoprost  (XALATAN ) 0.005 % ophthalmic solution Place 1 drop into both eyes at bedtime.      Multiple Vitamin (MULTIVITAMIN WITH MINERALS) TABS tablet Take 1 tablet by mouth in  the morning. Centrum Silver      Omega-3 Fatty Acids (FISH OIL PO) Take 1,000 mg by mouth in the morning.      primidone (MYSOLINE) 50 MG tablet Take 50 mg by mouth daily at 6 PM.      rosuvastatin  (CRESTOR ) 40 MG tablet TAKE 1 TABLET(40 MG) BY MOUTH DAILY 90 tablet 3    triamcinolone  cream (KENALOG) 0.1 % Apply 1 Application topically 2 (two) times daily as needed (eczema).      No current facility-administered medications for this visit.   Allergies  Allergen Reactions   Crestor   [Rosuvastatin ] Other (See Comments)    muscle weakness    Lipitor [Atorvastatin] Other (See Comments)    muscle weakness    Pravastatin Other (See Comments)    myalgias   Zetia [Ezetimibe] Other (See Comments)    muscle weakness     Social History   Tobacco Use   Smoking status: Never   Smokeless tobacco: Never  Substance Use Topics   Alcohol  use: Yes    Alcohol /week: 0.0 standard drinks of alcohol     Comment: 2 drinks per week(Sundays)    Family History  Problem Relation Age of Onset   Aneurysm Mother    Stroke Father      Review of Systems  Musculoskeletal:  Positive for arthralgias.  All other systems reviewed and are negative.   Objective:  Physical Exam Constitutional:      General: He is not in acute distress.    Appearance: Normal appearance. He is normal weight.  HENT:     Head: Normocephalic and atraumatic.  Eyes:     Extraocular Movements: Extraocular movements intact.     Conjunctiva/sclera: Conjunctivae normal.     Pupils: Pupils are equal, round, and reactive to light.  Cardiovascular:     Rate and Rhythm: Normal rate and regular rhythm.     Pulses: Normal pulses.     Heart sounds: Normal heart sounds.  Pulmonary:     Effort: Pulmonary effort is normal. No respiratory distress.     Breath sounds: Normal breath sounds.  Abdominal:     General: Bowel sounds are normal. There is no distension.     Palpations: Abdomen is soft.     Tenderness: There is no abdominal tenderness.  Musculoskeletal:        General: Tenderness present.     Cervical back: Normal range of motion and neck supple.     Comments: TTP over medial and lateral joint line, medial worse than lateral.  No calf tenderness, swelling, or erythema.  No overlying lesions of area of chief complaint.  Decreased strength and ROM due to elicited pain.  Pre-operative ROM 0-115.  Dorsiflexion and plantarflexion intact.  Stable to varus and valgus stress.  BLE appear grossly neurovascularly intact.   Gait mildly antalgic.   Lymphadenopathy:     Cervical: No cervical adenopathy.  Skin:    General: Skin is warm and dry.     Capillary Refill: Capillary refill takes less than 2 seconds.     Findings: No erythema or rash.  Neurological:     General: No focal deficit present.     Mental Status: He is alert and oriented to person, place, and time.  Psychiatric:        Mood and Affect: Mood normal.        Behavior: Behavior normal.     Vital signs in last 24 hours: @VSRANGES @  Labs:   Estimated body mass index is  30.33 kg/m as calculated from the following:   Height as of 05/10/23: 5' 3 (1.6 m).   Weight as of 05/10/23: 77.7 kg.   Imaging Review Plain radiographs demonstrate severe degenerative joint disease of the left knee(s). The overall alignment issignificant varus. The bone quality appears to be fair for age and reported activity level.     Assessment/Plan:  End stage arthritis, left knee   The patient history, physical examination, clinical judgment of the provider and imaging studies are consistent with end stage degenerative joint disease of the left knee(s) and total knee arthroplasty is deemed medically necessary. The treatment options including medical management, injection therapy arthroscopy and arthroplasty were discussed at length. The risks and benefits of total knee arthroplasty were presented and reviewed. The risks due to aseptic loosening, infection, stiffness, patella tracking problems, thromboembolic complications and other imponderables were discussed. The patient acknowledged the explanation, agreed to proceed with the plan and consent was signed. Patient is being admitted for inpatient treatment for surgery, pain control, PT, OT, prophylactic antibiotics, VTE prophylaxis, progressive ambulation and ADL's and discharge planning. The patient is planning to be discharged home with OPPT.    Anticipated LOS equal to or greater than 2 midnights due to - Age 23  and older with one or more of the following:  - Obesity  - Expected need for hospital services (PT, OT, Nursing) required for safe  discharge  - Anticipated need for postoperative skilled nursing care or inpatient rehab  - Active co-morbidities: prediabetes, aortic stenosis, erectile dysfunciton, thrombocytopenia, glaucoma, horseshoe kidney, GERD, HLD OR   - Unanticipated findings during/Post Surgery: None  - Patient is a high risk of re-admission due to: None

## 2023-08-12 NOTE — Anesthesia Preprocedure Evaluation (Addendum)
 Anesthesia Evaluation  Patient identified by MRN, date of birth, ID band Patient awake    Reviewed: Allergy & Precautions, H&P , NPO status , Patient's Chart, lab work & pertinent test results  Airway Mallampati: III  TM Distance: >3 FB Neck ROM: Full    Dental no notable dental hx. (+) Teeth Intact, Dental Advisory Given   Pulmonary neg pulmonary ROS   Pulmonary exam normal breath sounds clear to auscultation       Cardiovascular + Valvular Problems/Murmurs AS  Rhythm:Regular Rate:Normal     Neuro/Psych negative neurological ROS  negative psych ROS   GI/Hepatic Neg liver ROS,GERD  Medicated,,  Endo/Other  diabetes    Renal/GU negative Renal ROS  negative genitourinary   Musculoskeletal  (+) Arthritis , Osteoarthritis,    Abdominal   Peds  Hematology negative hematology ROS (+)   Anesthesia Other Findings   Reproductive/Obstetrics negative OB ROS                              Anesthesia Physical Anesthesia Plan  ASA: 4  Anesthesia Plan: General   Post-op Pain Management: Tylenol  PO (pre-op)* and Regional block*   Induction: Intravenous  PONV Risk Score and Plan: 3 and Ondansetron , Dexamethasone  and Treatment may vary due to age or medical condition  Airway Management Planned: Oral ETT  Additional Equipment: ClearSight  Intra-op Plan:   Post-operative Plan: Extubation in OR  Informed Consent: I have reviewed the patients History and Physical, chart, labs and discussed the procedure including the risks, benefits and alternatives for the proposed anesthesia with the patient or authorized representative who has indicated his/her understanding and acceptance.     Dental advisory given  Plan Discussed with: CRNA  Anesthesia Plan Comments: (See PAT note 08/11/2023)         Anesthesia Quick Evaluation

## 2023-08-12 NOTE — Progress Notes (Signed)
 Anesthesia Chart Review   Case: [id number] Date/Time: 08/23/23 1100   Procedure: ARTHROPLASTY, KNEE, TOTAL (Left: Knee)   Anesthesia type: Spinal   Diagnosis: Primary osteoarthritis of left knee [M17.12]   Pre-op diagnosis: osteoarthritis of left knee [   Location: [facility] / [facility]   Surgeons: Edna Toribio LABOR, MD       DISCUSSION:78 y.o. never smoker with h/o GERD, DM II, severe aortic stenosis (mean gradient 34.0 mmHg, valve area 085 cm2), left knee OA scheduled for above procedure 08/23/2023 with Dr. Toribio Edna.   Pt with severe AS, remains asymptomatic.   Per cardiology preoperative evaluation 07/19/2023, Mr. Dunson perioperative risk of a major cardiac event is 0.4% according to the Revised Cardiac Risk Index (RCRI). Per Dr. Court patient is at moderate risk for procedures given severe aortic stenosis, patient remains asymptomatic.  His functional capacity is good at 5.07 METs according to the [facility] Activity Status Index (DASI). Recommendations: According to ACC/AHA guidelines, no further cardiovascular testing needed.  The patient may proceed to surgery at acceptable risk.   Antiplatelet and/or Anticoagulation Recommendations: None requested.   S/p right total knee 05/10/23 with no anesthesia complications noted.  VS: BP (!) 150/82   Pulse 74   Temp 36.7 C (Oral)   Ht 5' 3 (1.6 m)   Wt 81.6 kg   SpO2 97%   BMI 31.89 kg/m   PROVIDERS: Koirala, Dibas, MD is PCP  Primary Cardiologist:  Dorn Court, MD   LABS: Labs reviewed: Acceptable for surgery. (all labs ordered are listed, but only abnormal results are displayed)  Labs Reviewed  CBC WITH DIFFERENTIAL/PLATELET - Abnormal; Notable for the following components:      Result Value   Hemoglobin 12.7 (*)    Platelets 120 (*)    All other components within normal limits  GLUCOSE, CAPILLARY - Abnormal; Notable for the following components:   Glucose-Capillary 101 (*)    All other components within normal  limits  SURGICAL PCR SCREEN  COMPREHENSIVE METABOLIC PANEL WITH GFR  HEMOGLOBIN A1C  TYPE AND SCREEN     IMAGES:   EKG:   CV: Echo 03/10/2023 1. Left ventricular ejection fraction, by estimation, is 60 to 65%. The  left ventricle has normal function. The left ventricle has no regional  wall motion abnormalities. There is mild left ventricular hypertrophy.  Left ventricular diastolic parameters  are indeterminate.   2. Right ventricular systolic function is normal. The right ventricular  size is normal. There is normal pulmonary artery systolic pressure.   3. The mitral valve is abnormal. Mild mitral valve regurgitation.  Moderate to severe mitral annular calcification.   4. The aortic valve is calcified. Aortic valve regurgitation is mild.  Severe aortic valve stenosis. Aortic valve area, by VTI measures 0.85 cm.  Aortic valve mean gradient measures 34.0 mmHg. Aortic valve Vmax measures  3.88 m/s. Peak gradient 60.2 mmHg,   DI 0.22.   5. The inferior vena cava is normal in size with greater than 50%  respiratory variability, suggesting right atrial pressure of 3 mmHg.   6. Left atrial size was mildly dilated.  Past Medical History:  Diagnosis Date   Aortic stenosis    severe in 03/2023   Arthritis    knee   Diabetes mellitus without complication (HCC)    Dysrhythmia    Eczema    ED (erectile dysfunction)    GERD (gastroesophageal reflux disease)    Horseshoe kidney    Hyperlipidemia  Neuromuscular disorder (HCC)    Sciatic nerve pain greater left -under physical therapy now   PAC (premature atrial contraction)     Past Surgical History:  Procedure Laterality Date   COLONOSCOPY WITH PROPOFOL  N/A 05/29/2014   Procedure: COLONOSCOPY WITH PROPOFOL ;  Surgeon: Gladis MARLA Louder, MD;  Location: [facility];  Service: Endoscopy;  Laterality: N/A;   TOTAL KNEE ARTHROPLASTY Right 05/10/2023   Procedure: ARTHROPLASTY, KNEE, TOTAL;  Surgeon: Edna Toribio LABOR, MD;   Location: [facility];  Service: Orthopedics;  Laterality: Right;    MEDICATIONS:  acetaminophen  (TYLENOL ) 650 MG CR tablet   bisacodyl (DULCOLAX) 5 MG EC tablet   brimonidine  (ALPHAGAN ) 0.2 % ophthalmic solution   calcium  carbonate (TUMS - DOSED IN MG ELEMENTAL CALCIUM ) 500 MG chewable tablet   CIALIS 20 MG tablet   diclofenac (VOLTAREN) 75 MG EC tablet   dorzolamide -timolol  (COSOPT ) 2-0.5 % ophthalmic solution   hydrocortisone  2.5 % cream   latanoprost  (XALATAN ) 0.005 % ophthalmic solution   Multiple Vitamin (MULTIVITAMIN WITH MINERALS) TABS tablet   Omega-3 Fatty Acids (FISH OIL PO)   primidone (MYSOLINE) 50 MG tablet   rosuvastatin  (CRESTOR ) 40 MG tablet   triamcinolone  cream (KENALOG) 0.1 %   No current facility-administered medications for this encounter.    Harlene Hoots Ward, PA-C [facility] [phone]

## 2023-08-13 DIAGNOSIS — Z1331 Encounter for screening for depression: Secondary | ICD-10-CM | POA: Diagnosis not present

## 2023-08-13 DIAGNOSIS — E7800 Pure hypercholesterolemia, unspecified: Secondary | ICD-10-CM | POA: Diagnosis not present

## 2023-08-13 DIAGNOSIS — Z0001 Encounter for general adult medical examination with abnormal findings: Secondary | ICD-10-CM | POA: Diagnosis not present

## 2023-08-13 DIAGNOSIS — D696 Thrombocytopenia, unspecified: Secondary | ICD-10-CM | POA: Diagnosis not present

## 2023-08-13 DIAGNOSIS — E119 Type 2 diabetes mellitus without complications: Secondary | ICD-10-CM | POA: Diagnosis not present

## 2023-08-13 DIAGNOSIS — Z79899 Other long term (current) drug therapy: Secondary | ICD-10-CM | POA: Diagnosis not present

## 2023-08-23 ENCOUNTER — Observation Stay (HOSPITAL_COMMUNITY)
Admission: RE | Admit: 2023-08-23 | Discharge: 2023-08-24 | Disposition: A | Discharge status: Home / Self Care | Source: Ambulatory Visit | Attending: Orthopedic Surgery | Admitting: Orthopedic Surgery

## 2023-08-23 ENCOUNTER — Ambulatory Visit (HOSPITAL_COMMUNITY): Payer: Self-pay | Admitting: Physician Assistant

## 2023-08-23 ENCOUNTER — Ambulatory Visit (HOSPITAL_COMMUNITY): Admitting: Anesthesiology

## 2023-08-23 ENCOUNTER — Other Ambulatory Visit: Payer: Self-pay

## 2023-08-23 ENCOUNTER — Encounter (HOSPITAL_COMMUNITY)
Admission: RE | Disposition: A | Discharge status: Home / Self Care | Payer: Self-pay | Source: Ambulatory Visit | Attending: Orthopedic Surgery

## 2023-08-23 ENCOUNTER — Encounter (HOSPITAL_COMMUNITY): Payer: Self-pay | Admitting: Orthopedic Surgery

## 2023-08-23 ENCOUNTER — Observation Stay (HOSPITAL_COMMUNITY)

## 2023-08-23 DIAGNOSIS — I10 Essential (primary) hypertension: Secondary | ICD-10-CM

## 2023-08-23 DIAGNOSIS — M1712 Unilateral primary osteoarthritis, left knee: Principal | ICD-10-CM | POA: Diagnosis present

## 2023-08-23 DIAGNOSIS — K219 Gastro-esophageal reflux disease without esophagitis: Secondary | ICD-10-CM | POA: Diagnosis not present

## 2023-08-23 DIAGNOSIS — E119 Type 2 diabetes mellitus without complications: Secondary | ICD-10-CM

## 2023-08-23 DIAGNOSIS — E785 Hyperlipidemia, unspecified: Secondary | ICD-10-CM | POA: Diagnosis not present

## 2023-08-23 DIAGNOSIS — F1090 Alcohol use, unspecified, uncomplicated: Secondary | ICD-10-CM | POA: Diagnosis not present

## 2023-08-23 DIAGNOSIS — Z96652 Presence of left artificial knee joint: Secondary | ICD-10-CM | POA: Diagnosis not present

## 2023-08-23 DIAGNOSIS — G8918 Other acute postprocedural pain: Secondary | ICD-10-CM | POA: Diagnosis not present

## 2023-08-23 DIAGNOSIS — Z7982 Long term (current) use of aspirin: Secondary | ICD-10-CM | POA: Insufficient documentation

## 2023-08-23 DIAGNOSIS — R609 Edema, unspecified: Secondary | ICD-10-CM | POA: Diagnosis not present

## 2023-08-23 HISTORY — PX: TOTAL KNEE ARTHROPLASTY: SHX125

## 2023-08-23 LAB — BASIC METABOLIC PANEL WITH GFR
Anion gap: 10 (ref 5–15)
BUN: 18 mg/dL (ref 8–23)
CO2: 19 mmol/L — ABNORMAL LOW (ref 22–32)
Calcium: 9.3 mg/dL (ref 8.9–10.3)
Chloride: 107 mmol/L (ref 98–111)
Creatinine, Ser: 0.98 mg/dL (ref 0.61–1.24)
GFR, Estimated: >60 mL/min (ref >60)
Glucose, Bld: 129 mg/dL — ABNORMAL HIGH (ref 70–99)
Potassium: 4.3 mmol/L (ref 3.5–5.1)
Sodium: 136 mmol/L (ref 135–145)

## 2023-08-23 LAB — CBC
HCT: 37.3 % — ABNORMAL LOW (ref 39.0–52.0)
Hemoglobin: 11.4 g/dL — ABNORMAL LOW (ref 13.0–17.0)
MCH: 27.8 pg (ref 26.0–34.0)
MCHC: 30.6 g/dL (ref 30.0–36.0)
MCV: 91 fL (ref 80.0–100.0)
Platelets: 100 K/uL — ABNORMAL LOW (ref 150–400)
RBC: 4.1 MIL/uL — ABNORMAL LOW (ref 4.22–5.81)
RDW: 14.5 % (ref 11.5–15.5)
WBC: 4.7 K/uL (ref 4.0–10.5)
nRBC: 0 % (ref 0.0–0.2)

## 2023-08-23 LAB — GLUCOSE, CAPILLARY
Glucose-Capillary: 105 mg/dL — ABNORMAL HIGH (ref 70–99)
Glucose-Capillary: 123 mg/dL — ABNORMAL HIGH (ref 70–99)

## 2023-08-23 SURGERY — ARTHROPLASTY, KNEE, TOTAL
Anesthesia: General | Site: Knee | Laterality: Left

## 2023-08-23 MED ORDER — FENTANYL CITRATE PF 50 MCG/ML IJ SOSY: 25.0000 ug | PREFILLED_SYRINGE | INTRAMUSCULAR | Status: DC | PRN

## 2023-08-23 MED ORDER — POLYETHYLENE GLYCOL 3350 17 G PO PACK: 17.0000 g | PACK | Freq: Every day | ORAL | 0 refills | Status: DC

## 2023-08-23 MED ORDER — CHLORHEXIDINE GLUCONATE 0.12 % MT SOLN
15.0000 mL | Freq: Once | OROMUCOSAL | Status: AC
  Administered 2023-08-23: 15 mL via OROMUCOSAL

## 2023-08-23 MED ORDER — ONDANSETRON HCL 4 MG PO TABS: 4.0000 mg | ORAL_TABLET | Freq: Four times a day (QID) | ORAL | Status: DC | PRN

## 2023-08-23 MED ORDER — PROPOFOL 10 MG/ML IV BOLUS
INTRAVENOUS | Status: AC
  Filled 2023-08-23: qty 20

## 2023-08-23 MED ORDER — BUPIVACAINE LIPOSOME 1.3 % IJ SUSP
INTRAMUSCULAR | Status: AC
  Filled 2023-08-23: qty 20

## 2023-08-23 MED ORDER — PROPOFOL 1000 MG/100ML IV EMUL
INTRAVENOUS | Status: AC
  Filled 2023-08-23: qty 100

## 2023-08-23 MED ORDER — POLYETHYLENE GLYCOL 3350 17 G PO PACK: 17.0000 g | PACK | Freq: Every day | ORAL | Status: DC | PRN

## 2023-08-23 MED ORDER — ISOPROPYL ALCOHOL 70 % SOLN
Status: DC | PRN
  Administered 2023-08-23: 1 via TOPICAL

## 2023-08-23 MED ORDER — OXYCODONE HCL 5 MG PO TABS
ORAL_TABLET | ORAL | Status: AC
  Filled 2023-08-23: qty 1

## 2023-08-23 MED ORDER — ONDANSETRON HCL 4 MG/2ML IJ SOLN: 4.0000 mg | Freq: Once | INTRAMUSCULAR | Status: DC | PRN

## 2023-08-23 MED ORDER — ONDANSETRON HCL 4 MG/2ML IJ SOLN: 4.0000 mg | Freq: Four times a day (QID) | INTRAMUSCULAR | Status: DC | PRN

## 2023-08-23 MED ORDER — OXYCODONE HCL 5 MG PO TABS: 5.0000 mg | ORAL_TABLET | ORAL | 0 refills | Status: AC | PRN

## 2023-08-23 MED ORDER — HYDROMORPHONE HCL 1 MG/ML IJ SOLN
0.5000 mg | INTRAMUSCULAR | Status: DC | PRN
  Administered 2023-08-23: 0.5 mg via INTRAVENOUS

## 2023-08-23 MED ORDER — CEFAZOLIN SODIUM-DEXTROSE 2-4 GM/100ML-% IV SOLN
2.0000 g | INTRAVENOUS | Status: AC
  Administered 2023-08-23: 2 g via INTRAVENOUS
  Filled 2023-08-23: qty 100

## 2023-08-23 MED ORDER — PANTOPRAZOLE SODIUM 40 MG PO TBEC
40.0000 mg | DELAYED_RELEASE_TABLET | Freq: Every day | ORAL | Status: DC
  Administered 2023-08-24: 40 mg via ORAL
  Filled 2023-08-23: qty 1

## 2023-08-23 MED ORDER — METHOCARBAMOL 500 MG PO TABS: 500.0000 mg | ORAL_TABLET | Freq: Three times a day (TID) | ORAL | 0 refills | Status: AC | PRN

## 2023-08-23 MED ORDER — ROCURONIUM BROMIDE 10 MG/ML (PF) SYRINGE
PREFILLED_SYRINGE | INTRAVENOUS | Status: AC
Start: 2023-08-23 — End: 2023-08-23
  Filled 2023-08-23: qty 10

## 2023-08-23 MED ORDER — OXYCODONE HCL 5 MG PO TABS
5.0000 mg | ORAL_TABLET | ORAL | Status: DC | PRN
  Administered 2023-08-23 – 2023-08-24 (×3): 5 mg via ORAL
  Filled 2023-08-23 (×2): qty 1

## 2023-08-23 MED ORDER — 0.9 % SODIUM CHLORIDE (POUR BTL) OPTIME
TOPICAL | Status: DC | PRN
  Administered 2023-08-23: 1000 mL

## 2023-08-23 MED ORDER — DORZOLAMIDE HCL-TIMOLOL MAL 2-0.5 % OP SOLN
1.0000 [drp] | Freq: Two times a day (BID) | OPHTHALMIC | Status: DC
  Administered 2023-08-23: 1 [drp] via OPHTHALMIC
  Filled 2023-08-23: qty 10

## 2023-08-23 MED ORDER — LATANOPROST 0.005 % OP SOLN
1.0000 [drp] | Freq: Every day | OPHTHALMIC | Status: DC
  Administered 2023-08-23: 1 [drp] via OPHTHALMIC
  Filled 2023-08-23: qty 2.5

## 2023-08-23 MED ORDER — POVIDONE-IODINE 10 % EX SWAB: 2.0000 | Freq: Once | CUTANEOUS | Status: DC

## 2023-08-23 MED ORDER — SODIUM CHLORIDE 0.9 % IV SOLN: INTRAVENOUS | Status: DC

## 2023-08-23 MED ORDER — METHOCARBAMOL 1000 MG/10ML IJ SOLN
500.0000 mg | Freq: Four times a day (QID) | INTRAMUSCULAR | Status: DC | PRN
Start: 2023-08-23 — End: 2023-08-24

## 2023-08-23 MED ORDER — SUGAMMADEX SODIUM 200 MG/2ML IV SOLN
INTRAVENOUS | Status: DC | PRN
Start: 2023-08-23 — End: 2023-08-23
  Administered 2023-08-23: 160 mg via INTRAVENOUS

## 2023-08-23 MED ORDER — MENTHOL 3 MG MT LOZG: 1.0000 | LOZENGE | OROMUCOSAL | Status: DC | PRN

## 2023-08-23 MED ORDER — LIDOCAINE HCL (PF) 2 % IJ SOLN
INTRAMUSCULAR | Status: AC
  Filled 2023-08-23: qty 5

## 2023-08-23 MED ORDER — ONDANSETRON HCL 4 MG/2ML IJ SOLN
INTRAMUSCULAR | Status: AC
  Filled 2023-08-23: qty 2

## 2023-08-23 MED ORDER — ORAL CARE MOUTH RINSE: 15.0000 mL | Freq: Once | OROMUCOSAL | Status: AC

## 2023-08-23 MED ORDER — TRANEXAMIC ACID-NACL 1000-0.7 MG/100ML-% IV SOLN
1000.0000 mg | INTRAVENOUS | Status: AC
  Administered 2023-08-23: 1000 mg via INTRAVENOUS
  Filled 2023-08-23: qty 100

## 2023-08-23 MED ORDER — PHENOL 1.4 % MT LIQD: 1.0000 | OROMUCOSAL | Status: DC | PRN

## 2023-08-23 MED ORDER — BUPIVACAINE LIPOSOME 1.3 % IJ SUSP: 20.0000 mL | Freq: Once | INTRAMUSCULAR | Status: DC

## 2023-08-23 MED ORDER — ACETAMINOPHEN 325 MG PO TABS: 325.0000 mg | ORAL_TABLET | Freq: Four times a day (QID) | ORAL | Status: DC | PRN

## 2023-08-23 MED ORDER — BRIMONIDINE TARTRATE 0.2 % OP SOLN
1.0000 [drp] | Freq: Two times a day (BID) | OPHTHALMIC | Status: DC
  Administered 2023-08-23: 1 [drp] via OPHTHALMIC
  Filled 2023-08-23: qty 5

## 2023-08-23 MED ORDER — BUPIVACAINE-EPINEPHRINE (PF) 0.25% -1:200000 IJ SOLN
INTRAMUSCULAR | Status: AC
  Filled 2023-08-23: qty 30

## 2023-08-23 MED ORDER — LACTATED RINGERS IV SOLN: INTRAVENOUS | Status: DC

## 2023-08-23 MED ORDER — ASPIRIN 81 MG PO CHEW
81.0000 mg | CHEWABLE_TABLET | Freq: Two times a day (BID) | ORAL | Status: DC
  Administered 2023-08-23 – 2023-08-24 (×2): 81 mg via ORAL
  Filled 2023-08-23 (×2): qty 1

## 2023-08-23 MED ORDER — DOCUSATE SODIUM 100 MG PO CAPS
100.0000 mg | ORAL_CAPSULE | Freq: Two times a day (BID) | ORAL | Status: DC
  Administered 2023-08-23 – 2023-08-24 (×2): 100 mg via ORAL
  Filled 2023-08-23 (×2): qty 1

## 2023-08-23 MED ORDER — EPHEDRINE 5 MG/ML INJ
INTRAVENOUS | Status: AC
  Filled 2023-08-23: qty 5

## 2023-08-23 MED ORDER — ACETAMINOPHEN 500 MG PO TABS
1000.0000 mg | ORAL_TABLET | Freq: Once | ORAL | Status: AC
  Administered 2023-08-23: 1000 mg via ORAL
  Filled 2023-08-23: qty 2

## 2023-08-23 MED ORDER — CELECOXIB 100 MG PO CAPS: 100.0000 mg | ORAL_CAPSULE | Freq: Two times a day (BID) | ORAL | 0 refills | Status: AC

## 2023-08-23 MED ORDER — DEXAMETHASONE SODIUM PHOSPHATE 10 MG/ML IJ SOLN
8.0000 mg | Freq: Once | INTRAMUSCULAR | Status: AC
  Administered 2023-08-23: 8 mg via INTRAVENOUS

## 2023-08-23 MED ORDER — ROCURONIUM BROMIDE 100 MG/10ML IV SOLN
INTRAVENOUS | Status: DC | PRN
  Administered 2023-08-23: 50 mg via INTRAVENOUS

## 2023-08-23 MED ORDER — FENTANYL CITRATE PF 50 MCG/ML IJ SOSY
50.0000 ug | PREFILLED_SYRINGE | Freq: Once | INTRAMUSCULAR | Status: AC
  Administered 2023-08-23: 50 ug via INTRAVENOUS
  Filled 2023-08-23: qty 2

## 2023-08-23 MED ORDER — INSULIN ASPART 100 UNIT/ML IJ SOLN: 0.0000 [IU] | INTRAMUSCULAR | Status: DC | PRN

## 2023-08-23 MED ORDER — METHOCARBAMOL 500 MG PO TABS
500.0000 mg | ORAL_TABLET | Freq: Four times a day (QID) | ORAL | Status: DC | PRN
  Administered 2023-08-23: 500 mg via ORAL
  Filled 2023-08-23: qty 1

## 2023-08-23 MED ORDER — BUPIVACAINE-EPINEPHRINE (PF) 0.5% -1:200000 IJ SOLN
INTRAMUSCULAR | Status: DC | PRN
Start: 2023-08-23 — End: 2023-08-23
  Administered 2023-08-23: 20 mL via PERINEURAL

## 2023-08-23 MED ORDER — BUPIVACAINE-EPINEPHRINE (PF) 0.25% -1:200000 IJ SOLN
INTRAMUSCULAR | Status: DC | PRN
  Administered 2023-08-23: 80 mL

## 2023-08-23 MED ORDER — PRIMIDONE 50 MG PO TABS
50.0000 mg | ORAL_TABLET | Freq: Every day | ORAL | Status: DC
  Administered 2023-08-23: 50 mg via ORAL
  Filled 2023-08-23 (×2): qty 1

## 2023-08-23 MED ORDER — KETOROLAC TROMETHAMINE 15 MG/ML IJ SOLN
7.5000 mg | Freq: Four times a day (QID) | INTRAMUSCULAR | Status: AC
  Administered 2023-08-23 – 2023-08-24 (×4): 7.5 mg via INTRAVENOUS
  Filled 2023-08-23 (×4): qty 1

## 2023-08-23 MED ORDER — ONDANSETRON HCL 4 MG PO TABS: 4.0000 mg | ORAL_TABLET | Freq: Three times a day (TID) | ORAL | 0 refills | Status: AC | PRN

## 2023-08-23 MED ORDER — ROSUVASTATIN CALCIUM 20 MG PO TABS
40.0000 mg | ORAL_TABLET | Freq: Every day | ORAL | Status: DC
  Administered 2023-08-24: 40 mg via ORAL
  Filled 2023-08-23: qty 2

## 2023-08-23 MED ORDER — OMEPRAZOLE 40 MG PO CPDR: 40.0000 mg | DELAYED_RELEASE_CAPSULE | Freq: Every day | ORAL | 0 refills | Status: AC

## 2023-08-23 MED ORDER — AMISULPRIDE (ANTIEMETIC) 5 MG/2ML IV SOLN: 10.0000 mg | Freq: Once | INTRAVENOUS | Status: DC | PRN

## 2023-08-23 MED ORDER — SODIUM CHLORIDE (PF) 0.9 % IJ SOLN
INTRAMUSCULAR | Status: AC
  Filled 2023-08-23: qty 30

## 2023-08-23 MED ORDER — ASPIRIN 81 MG PO TBEC: 81.0000 mg | DELAYED_RELEASE_TABLET | Freq: Two times a day (BID) | ORAL | Status: AC

## 2023-08-23 MED ORDER — DIPHENHYDRAMINE HCL 12.5 MG/5ML PO ELIX: 12.5000 mg | ORAL_SOLUTION | ORAL | Status: DC | PRN

## 2023-08-23 MED ORDER — SUGAMMADEX SODIUM 200 MG/2ML IV SOLN
INTRAVENOUS | Status: AC
Start: 2023-08-23 — End: 2023-08-23
  Filled 2023-08-23: qty 2

## 2023-08-23 MED ORDER — PHENYLEPHRINE HCL-NACL 20-0.9 MG/250ML-% IV SOLN
INTRAVENOUS | Status: DC | PRN
Start: 2023-08-23 — End: 2023-08-23
  Administered 2023-08-23: 15 ug/min via INTRAVENOUS

## 2023-08-23 MED ORDER — HYDROMORPHONE HCL 1 MG/ML IJ SOLN
INTRAMUSCULAR | Status: AC
  Filled 2023-08-23: qty 1

## 2023-08-23 MED ORDER — WATER FOR IRRIGATION, STERILE IR SOLN
Status: DC | PRN
  Administered 2023-08-23: 2000 mL

## 2023-08-23 MED ORDER — LIDOCAINE HCL (CARDIAC) PF 100 MG/5ML IV SOSY
PREFILLED_SYRINGE | INTRAVENOUS | Status: DC | PRN
  Administered 2023-08-23: 60 mg via INTRAVENOUS

## 2023-08-23 MED ORDER — PROPOFOL 10 MG/ML IV BOLUS
INTRAVENOUS | Status: DC | PRN
  Administered 2023-08-23: 150 ug/kg/min via INTRAVENOUS
  Administered 2023-08-23 (×3): 50 mg via INTRAVENOUS
  Administered 2023-08-23: 150 mg via INTRAVENOUS

## 2023-08-23 MED ORDER — PHENYLEPHRINE HCL-NACL 20-0.9 MG/250ML-% IV SOLN
INTRAVENOUS | Status: AC
  Filled 2023-08-23: qty 250

## 2023-08-23 MED ORDER — ZOLPIDEM TARTRATE 5 MG PO TABS: 5.0000 mg | ORAL_TABLET | Freq: Every evening | ORAL | Status: DC | PRN

## 2023-08-23 MED ORDER — PHENYLEPHRINE 80 MCG/ML (10ML) SYRINGE FOR IV PUSH (FOR BLOOD PRESSURE SUPPORT)
PREFILLED_SYRINGE | INTRAVENOUS | Status: AC
  Filled 2023-08-23: qty 10

## 2023-08-23 MED ORDER — ACETAMINOPHEN 500 MG PO TABS: 1000.0000 mg | ORAL_TABLET | Freq: Three times a day (TID) | ORAL | Status: AC | PRN

## 2023-08-23 MED ORDER — MIDAZOLAM HCL 2 MG/2ML IJ SOLN
1.0000 mg | Freq: Once | INTRAMUSCULAR | Status: DC
  Filled 2023-08-23: qty 2

## 2023-08-23 MED ORDER — DEXAMETHASONE SODIUM PHOSPHATE 10 MG/ML IJ SOLN
INTRAMUSCULAR | Status: AC
  Filled 2023-08-23: qty 1

## 2023-08-23 MED ORDER — CEFAZOLIN SODIUM-DEXTROSE 2-4 GM/100ML-% IV SOLN
2.0000 g | Freq: Four times a day (QID) | INTRAVENOUS | Status: AC
  Administered 2023-08-23 – 2023-08-24 (×2): 2 g via INTRAVENOUS
  Filled 2023-08-23 (×2): qty 100

## 2023-08-23 MED ORDER — FENTANYL CITRATE (PF) 100 MCG/2ML IJ SOLN
INTRAMUSCULAR | Status: AC
  Filled 2023-08-23: qty 2

## 2023-08-23 MED ORDER — FENTANYL CITRATE (PF) 100 MCG/2ML IJ SOLN
INTRAMUSCULAR | Status: DC | PRN
  Administered 2023-08-23 (×2): 50 ug via INTRAVENOUS

## 2023-08-23 MED ORDER — ACETAMINOPHEN 500 MG PO TABS
1000.0000 mg | ORAL_TABLET | Freq: Four times a day (QID) | ORAL | Status: AC
  Administered 2023-08-23 – 2023-08-24 (×4): 1000 mg via ORAL
  Filled 2023-08-23 (×4): qty 2

## 2023-08-23 MED ORDER — PHENYLEPHRINE 80 MCG/ML (10ML) SYRINGE FOR IV PUSH (FOR BLOOD PRESSURE SUPPORT)
PREFILLED_SYRINGE | INTRAVENOUS | Status: DC | PRN
  Administered 2023-08-23: 160 ug via INTRAVENOUS

## 2023-08-23 MED ORDER — SODIUM CHLORIDE 0.9 % IR SOLN
Status: DC | PRN
  Administered 2023-08-23: 3000 mL

## 2023-08-23 SURGICAL SUPPLY — 52 items
BAG COUNTER SPONGE SURGICOUNT (BAG) IMPLANT
BLADE SAG 18X100X1.27 (BLADE) ×1 IMPLANT
BLADE SAW SAG 35X64 .89 (BLADE) ×1 IMPLANT
BLADE SAW SGTL 11.0X1.19X90.0M (BLADE) IMPLANT
BNDG COHESIVE 3X5 TAN ST LF (GAUZE/BANDAGES/DRESSINGS) ×1 IMPLANT
BNDG ELASTIC 6INX 5YD STR LF (GAUZE/BANDAGES/DRESSINGS) IMPLANT
BNDG ELASTIC 6X10 VLCR STRL LF (GAUZE/BANDAGES/DRESSINGS) ×1 IMPLANT
BOWL SMART MIX CTS (DISPOSABLE) IMPLANT
CEMENT BONE REFOBACIN R1X40 US (Cement) IMPLANT
CHLORAPREP W/TINT 26 (MISCELLANEOUS) ×2 IMPLANT
COMPONENT MED PLY PERSS6-7 12 (Joint) IMPLANT
COVER SURGICAL LIGHT HANDLE (MISCELLANEOUS) ×1 IMPLANT
CUFF TRNQT CYL 34X4.125X (TOURNIQUET CUFF) ×1 IMPLANT
DERMABOND ADVANCED .7 DNX12 (GAUZE/BANDAGES/DRESSINGS) ×1 IMPLANT
DRAPE INCISE IOBAN 85X60 (DRAPES) ×1 IMPLANT
DRAPE SHEET LG 3/4 BI-LAMINATE (DRAPES) ×1 IMPLANT
DRAPE U-SHAPE 47X51 STRL (DRAPES) ×1 IMPLANT
DRSG AQUACEL AG ADV 3.5X10 (GAUZE/BANDAGES/DRESSINGS) ×1 IMPLANT
ELECT REM PT RETURN 15FT ADLT (MISCELLANEOUS) ×1 IMPLANT
FEMUR CMTD CR STD SZ 6 LT KNEE (Joint) IMPLANT
GAUZE SPONGE 4X4 12PLY STRL (GAUZE/BANDAGES/DRESSINGS) ×1 IMPLANT
GLOVE BIO SURGEON STRL SZ 6.5 (GLOVE) ×2 IMPLANT
GLOVE BIOGEL PI IND STRL 6.5 (GLOVE) ×1 IMPLANT
GLOVE BIOGEL PI IND STRL 8 (GLOVE) ×1 IMPLANT
GLOVE SURG ORTHO 8.0 STRL STRW (GLOVE) ×2 IMPLANT
GOWN STRL REUS W/ TWL XL LVL3 (GOWN DISPOSABLE) ×2 IMPLANT
HOLDER FOLEY CATH W/STRAP (MISCELLANEOUS) ×1 IMPLANT
HOOD PEEL AWAY T7 (MISCELLANEOUS) ×3 IMPLANT
KIT TURNOVER KIT A (KITS) ×1 IMPLANT
MANIFOLD NEPTUNE II (INSTRUMENTS) ×1 IMPLANT
MARKER SKIN DUAL TIP RULER LAB (MISCELLANEOUS) ×1 IMPLANT
NS IRRIG 1000ML POUR BTL (IV SOLUTION) ×1 IMPLANT
PACK TOTAL KNEE CUSTOM (KITS) ×1 IMPLANT
PENCIL SMOKE EVACUATOR (MISCELLANEOUS) ×1 IMPLANT
PIN DRILL HDLS TROCAR 75 4PK (PIN) IMPLANT
SCREW HEADED 33MM KNEE (MISCELLANEOUS) IMPLANT
SET HNDPC FAN SPRY TIP SCT (DISPOSABLE) ×1 IMPLANT
SOLUTION IRRIG SURGIPHOR (IV SOLUTION) IMPLANT
SOLUTION PRONTOSAN WOUND 350ML (IRRIGATION / IRRIGATOR) IMPLANT
STEM POLY PAT PLY 32M KNEE (Knees) IMPLANT
STEM TIBIA 5 DEG SZ D L KNEE (Knees) IMPLANT
STRIP CLOSURE SKIN 1/2X4 (GAUZE/BANDAGES/DRESSINGS) ×1 IMPLANT
SUT MNCRL AB 3-0 PS2 18 (SUTURE) ×1 IMPLANT
SUT STRATAFIX 14 PDO 48 VLT (SUTURE) ×1 IMPLANT
SUT VIC AB 2-0 CT2 27 (SUTURE) ×2 IMPLANT
SUTURE STRATFX 0 PDS 27 VIOLET (SUTURE) ×1 IMPLANT
SYR 50ML LL SCALE MARK (SYRINGE) ×1 IMPLANT
TRAY FOLEY MTR SLVR 14FR STAT (SET/KITS/TRAYS/PACK) IMPLANT
TUBE SUCTION HIGH CAP CLEAR NV (SUCTIONS) ×1 IMPLANT
UNDERPAD 30X36 HEAVY ABSORB (UNDERPADS AND DIAPERS) ×1 IMPLANT
WATER STERILE IRR 1000ML POUR (IV SOLUTION) IMPLANT
WRAP KNEE MAXI GEL POST OP (GAUZE/BANDAGES/DRESSINGS) ×1 IMPLANT

## 2023-08-23 NOTE — Anesthesia Procedure Notes (Signed)
 Anesthesia Regional Block: Adductor canal block   Pre-Anesthetic Checklist: , timeout performed,  Correct Patient, Correct Site, Correct Laterality,  Correct Procedure, Correct Position, site marked,  Risks and benefits discussed,  Pre-op evaluation,  At surgeon's request and post-op pain management  Laterality: Left  Prep: Maximum Sterile Barrier Precautions used, chloraprep       Needles:  Injection technique: Single-shot  Needle Type: Echogenic Stimulator Needle     Needle Length: 9cm  Needle Gauge: 21     Additional Needles:   Procedures:,,,, ultrasound used (permanent image in chart),,    Narrative:  Start time: 08/23/2023 10:43 AM End time: 08/23/2023 10:53 AM Injection made incrementally with aspirations every 5 mL.  Performed by: Personally  Anesthesiologist: Epifanio Fallow, MD

## 2023-08-23 NOTE — Progress Notes (Signed)
 Orthopedic Tech Progress Note Patient Details:  Allen Sanchez 10-13-1945 [id number] Applied bone foam per order.  Ortho Devices Type of Ortho Device: Bone foam zero knee Ortho Device/Splint Location: LLE Ortho Device/Splint Interventions: Ordered, Application, Adjustment   Post Interventions Patient Tolerated: Well Instructions Provided: Adjustment of device, Care of device, Poper ambulation with device  Morna Pink 08/23/2023, 3:16 PM

## 2023-08-23 NOTE — Interval H&P Note (Signed)

## 2023-08-23 NOTE — Anesthesia Postprocedure Evaluation (Signed)
 Anesthesia Post Note  Patient: Allen Sanchez  Procedure(s) Performed: ARTHROPLASTY, KNEE, TOTAL (Left: Knee)     Patient location during evaluation: PACU Anesthesia Type: General and Regional Level of consciousness: awake and alert Pain management: pain level controlled Vital Signs Assessment: post-procedure vital signs reviewed and stable Respiratory status: spontaneous breathing, nonlabored ventilation and respiratory function stable Cardiovascular status: blood pressure returned to baseline and stable Postop Assessment: no apparent nausea or vomiting Anesthetic complications: no   No notable events documented.  Last Vitals:  Vitals:   08/23/23 1615 08/23/23 1618  BP: (!) 128/117 (!) 140/77  Pulse: 75   Resp: 13   Temp:    SpO2: 100%     Last Pain:  Vitals:   08/23/23 1615  TempSrc:   PainSc: 3                  Graison Leinberger,W. EDMOND

## 2023-08-23 NOTE — Discharge Instructions (Signed)

## 2023-08-23 NOTE — Progress Notes (Signed)
 PT Note  Patient Details Name: Allen Sanchez MRN: [id number] DOB: 20-Apr-1945     Attempted sessionwwith patient this evening. Pt very nice about it , but did not want to get up at this time. LLE with still some numbness, but mostly resolving, motor with SLR still no present.   Encouraged pt and nurse tech that pt needs to dangle or be OOB tonight.   Will see pt in the morning.    MILLICENT SALES 08/23/2023, 6:17 PM

## 2023-08-23 NOTE — Anesthesia Procedure Notes (Signed)
 Procedure Name: Intubation Date/Time: 08/23/2023 12:09 PM  Performed by: Nada Corean CROME, CRNAPre-anesthesia Checklist: Suction available, Emergency Drugs available, Patient identified, Timeout performed and Patient being monitored Patient Re-evaluated:Patient Re-evaluated prior to induction Oxygen Delivery Method: Circle system utilized Preoxygenation: Pre-oxygenation with 100% oxygen Induction Type: IV induction Ventilation: Mask ventilation without difficulty Laryngoscope Size: Mac and 4 Grade View: Grade II Tube type: Oral Tube size: 7.0 mm Number of attempts: 1 Airway Equipment and Method: Stylet Placement Confirmation: ETT inserted through vocal cords under direct vision, positive ETCO2 and breath sounds checked- equal and bilateral Secured at: 21 cm Tube secured with: Tape Dental Injury: Teeth and Oropharynx as per pre-operative assessment

## 2023-08-23 NOTE — Op Note (Signed)
 DATE OF SURGERY:  08/23/2023 TIME: 1:50 PM  PATIENT NAME:  Allen Sanchez   AGE: 78 y.o.    PRE-OPERATIVE DIAGNOSIS: End-stage left knee osteoarthritis  POST-OPERATIVE DIAGNOSIS:  Same  PROCEDURE: Cemented left total Knee Arthroplasty  SURGEON:  Japleen Tornow A Marinell Igarashi, MD   ASSISTANT: Jon Hurst, RNFA, present and scrubbed throughout the case, critical for assistance with exposure, retraction, instrumentation, and closure.   OPERATIVE IMPLANTS:  Cemented Zimmer persona size 6 left standard CR femur, left size D tibial baseplate, 12 mm MC poly insert, 32 mm all poly patella Implant Name Type Inv. Item Serial No. Manufacturer Lot No. LRB No. Used Action  CEMENT BONE REFOBACIN R1X40 US  - [id number] Cement CEMENT BONE REFOBACIN R1X40 US   [employer](ORTH,TRAU,BIO,SG) [id number] Left 1 Implanted  CEMENT BONE REFOBACIN R1X40 US  - [id number] Cement CEMENT BONE REFOBACIN R1X40 US   [employer](ORTH,TRAU,BIO,SG) [id number] Left 1 Implanted  STEM POLY PAT PLY 72M KNEE - [id number] Knees STEM POLY PAT PLY 72M KNEE  [employer](ORTH,TRAU,BIO,SG) [id number] Left 1 Implanted  FEMUR CMTD CR STD SZ 6 LT KNEE - [id number] Joint FEMUR CMTD CR STD SZ 6 LT KNEE  [employer](ORTH,TRAU,BIO,SG) [id number] Left 1 Implanted  STEM TIBIA 5 DEG SZ D L KNEE - [id number] Knees STEM TIBIA 5 DEG SZ D L KNEE  [employer]ON(ORTH,TRAU,BIO,SG) [id number] Left 1 Implanted  COMPONENT MED PLY PERSS6-7 12 - [id number] Joint COMPONENT MED PLY PERSS6-7 12  [employer](ORTH,TRAU,BIO,SG) [id number] Left 1 Implanted      PREOPERATIVE INDICATIONS:  Allen Sanchez is a 78 y.o. year old male with end stage bone on bone degenerative arthritis of the knee who failed conservative treatment, including injections, antiinflammatories, activity modification, and assistive devices, and had significant impairment of their activities of daily living, and elected for Total Knee Arthroplasty.   The risks, benefits, and alternatives were discussed  at length including but not limited to the risks of infection, bleeding, nerve injury, stiffness, blood clots, the need for revision surgery, cardiopulmonary complications, among others, and they were willing to proceed.  ESTIMATED BLOOD LOSS: 200cc  OPERATIVE DESCRIPTION:   Once adequate anesthesia was induced, preoperative antibiotics, 2 gm of ancef ,1 gm of Tranexamic Acid , and 8 mg of Decadron  administered, the patient was positioned supine with a left thigh tourniquet placed.  The left lower extremity was prepped and draped in sterile fashion.  A time-  out was performed identifying the patient, planned procedure, and the appropriate extremity.     The leg was  exsanguinated, tourniquet elevated to 300 mmHg.  A midline incision was  made followed by median parapatellar arthrotomy. Anterior horn of the medial meniscus was released and resected. A medial release was performed, the infrapatellar fat pad was resected with care taken to protect the patellar tendon. The suprapatellar fat was removed to exposed the distal anterior femur. The anterior horn of the lateral meniscus and ACL were released.    Following initial  exposure, I first started with the femur  The femoral  canal was opened with a drill, canal was suctioned to try to prevent fat emboli.  An  intramedullary rod was passed set at 6 degrees valgus, 10 mm. The distal femur was resected.  Following this resection, the tibia was  subluxated anteriorly.  Using the extramedullary guide, 10 mm of bone was resected off   the proximal lateral tibia.  Given severe medial bone loss was not fully underneath the medial defect so resected additional 2 mm which allowed for a clean  flat plane now on the tibial plateau.  We confirmed the gap would be  stable medially and laterally with a size 10mm spacer block as well as confirmed that the tibial cut was perpendicular in the coronal plane, checking with an alignment rod.    Once this was done, the  posterior femoral referencing femoral sizer was placed under to the posterior condyles with 5 degrees of external rotational which was parallel to the transepicondylar axis and perpendicular to [employer]. The femur was sized to be a size 6 in the anterior-  posterior dimension. The  anterior, posterior, and  chamfer cuts were made without difficulty nor   notching making certain that I was along the anterior cortex to help  with flexion gap stability. Next a laminar spreader was placed with the knee in flexion and the medial lateral menisci were resected.  5 cc of the Exparel  mixture was injected in the medial side of the back of the knee and 3 cc in the lateral side.  1/2 inch curved osteotome was used to resect posterior osteophyte that was then removed with a pituitary rongeur.       At this point, the tibia was sized to be a size D.  The size D tray was  then pinned in position. Trial reduction was now carried with a 6 femur, D tibia, a 10 mm MC insert.  There was laxity with the 10 mm insert.  We upsized to the 12 mm insert.  The knee had full extension and was stable to varus valgus stress in extension.  The knee was slightly tight in flexion and the PCL was partially released.   Attention was next directed to the patella.  Precut  measurement was noted to be 24 mm.  I resected down to 14 mm and used a  32mm patellar button to restore patellar height as well as cover the cut surface.     The patella lug holes were drilled and a 32mm patella poly trial was placed.    The knee was brought to full extension with good flexion stability with the patella tracking through the trochlea without application of pressure.     Next the femoral component was again assessed and determined to be seated and appropriately lateralized.  The femoral lug holes were drilled.  The femoral component was then removed. Tibial component was again assessed and felt to be seated and appropriately rotated with the medial  third of the tubercle. The tibia was then drilled, and keel punched.     Final components were  opened and cement was mixed.      Final implants were then  cemented onto cleaned and dried cut surfaces of bone with the knee brought to extension with a 12mm MC poly.  The knee was irrigated with sterile Betadine  diluted in saline as well as pulse lavage normal saline.  The synovial lining was  then injected a dilute Exparel  with 30cc of 0.25% marcaine  with epinephrine .         Once the cement had fully cured, excess cement was removed throughout the knee.  I confirmed that I was satisfied with the range of motion and stability, and the final 12 mm MC poly insert was chosen.  It was placed into the knee.         The tourniquet had been let down.  No significant hemostasis was required.  The medial parapatellar arthrotomy was then reapproximated using #1 Stratafix sutures with the knee  in flexion.  The remaining wound was closed with 0 stratafix, 2-0 Vicryl, and running 3-0 Monocryl. The knee was cleaned, dried, dressed sterilely using Dermabond and   Aquacel dressing.  The patient was then brought to recovery room in stable condition, tolerating the procedure  well. There were no complications.   Post op recs: WB: WBAT Abx: ancef  Imaging: PACU xrays DVT prophylaxis: Aspirin  81mg  BID x4 weeks Follow up: 2 weeks after surgery for a wound check with Dr. Edna at [facility].  Address: [address], [place], [place] [address]  Office Phone: [phone]  Toribio Edna, MD Orthopaedic Surgery

## 2023-08-23 NOTE — Transfer of Care (Signed)
 Immediate Anesthesia Transfer of Care Note  Patient: Allen Sanchez  Procedure(s) Performed: ARTHROPLASTY, KNEE, TOTAL (Left: Knee)  Patient Location: PACU  Anesthesia Type:General and Regional  Level of Consciousness: awake, alert , oriented, and patient cooperative  Airway & Oxygen Therapy: Patient Spontanous Breathing and Patient connected to face mask oxygen  Post-op Assessment: Report given to RN and Post -op Vital signs reviewed and stable  Post vital signs: Reviewed and stable  Last Vitals:  Vitals Value Taken Time  BP 113/57 08/23/23 14:34  Temp 36.1 C 08/23/23 14:34  Pulse 63 08/23/23 14:37  Resp 15 08/23/23 14:38  SpO2 100 % 08/23/23 14:37  Vitals shown include unfiled device data.  Last Pain:  Vitals:   08/23/23 1055  TempSrc:   PainSc: 0-No pain         Complications: No notable events documented.

## 2023-08-24 ENCOUNTER — Encounter (HOSPITAL_COMMUNITY): Payer: Self-pay | Admitting: Orthopedic Surgery

## 2023-08-24 DIAGNOSIS — Z7982 Long term (current) use of aspirin: Secondary | ICD-10-CM | POA: Diagnosis not present

## 2023-08-24 DIAGNOSIS — E785 Hyperlipidemia, unspecified: Secondary | ICD-10-CM | POA: Diagnosis not present

## 2023-08-24 DIAGNOSIS — M1712 Unilateral primary osteoarthritis, left knee: Secondary | ICD-10-CM | POA: Diagnosis not present

## 2023-08-24 DIAGNOSIS — K219 Gastro-esophageal reflux disease without esophagitis: Secondary | ICD-10-CM | POA: Diagnosis not present

## 2023-08-24 DIAGNOSIS — F1090 Alcohol use, unspecified, uncomplicated: Secondary | ICD-10-CM | POA: Diagnosis not present

## 2023-08-24 LAB — CBC
HCT: 35.2 % — ABNORMAL LOW (ref 39.0–52.0)
Hemoglobin: 10.8 g/dL — ABNORMAL LOW (ref 13.0–17.0)
MCH: 27.3 pg (ref 26.0–34.0)
MCHC: 30.7 g/dL (ref 30.0–36.0)
MCV: 89.1 fL (ref 80.0–100.0)
Platelets: 101 K/uL — ABNORMAL LOW (ref 150–400)
RBC: 3.95 MIL/uL — ABNORMAL LOW (ref 4.22–5.81)
RDW: 14.4 % (ref 11.5–15.5)
WBC: 6.6 K/uL (ref 4.0–10.5)
nRBC: 0 % (ref 0.0–0.2)

## 2023-08-24 LAB — BASIC METABOLIC PANEL WITH GFR
Anion gap: 7 (ref 5–15)
BUN: <5 mg/dL — ABNORMAL LOW (ref 8–23)
CO2: 20 mmol/L — ABNORMAL LOW (ref 22–32)
Calcium: 9.2 mg/dL (ref 8.9–10.3)
Chloride: 107 mmol/L (ref 98–111)
Creatinine, Ser: 0.81 mg/dL (ref 0.61–1.24)
GFR, Estimated: >60 mL/min (ref >60)
Glucose, Bld: 126 mg/dL — ABNORMAL HIGH (ref 70–99)
Potassium: 4.5 mmol/L (ref 3.5–5.1)
Sodium: 134 mmol/L — ABNORMAL LOW (ref 135–145)

## 2023-08-24 NOTE — Progress Notes (Signed)
 Discharge instructions given to patient questions asked and answered  D Johnie NR

## 2023-08-24 NOTE — TOC Transition Note (Signed)
 Transition of Care [facility]) - Discharge Note   Patient Details  Name: Allen Sanchez MRN: [id number] Date of Birth: 22-Dec-1945  Transition of [facility]) CM/SW Contact:  NORMAN ASPEN, LCSW Phone Number: 08/24/2023, 10:13 AM   Clinical Narrative:     Met with pt who confirms he has needed DME in the home.  Confirmed with ortho MD office and pt and HHPT prearranged with [facility] prior to surgery.  No further TOC needs.  Final next level of care: Home w Home Health Services Barriers to Discharge: No Barriers Identified   Patient Goals and CMS Choice Patient states their goals for this hospitalization and ongoing recovery are:: return home          Discharge Placement                       Discharge Plan and Services Additional resources added to the After Visit Summary for                  DME Arranged: N/A DME Agency: NA       HH Arranged: PT HH Agency: [facility] ([facility])        Social Drivers of Health (SDOH) Interventions SDOH Screenings   Food Insecurity: No Food Insecurity (08/23/2023)  Housing: Low Risk  (08/23/2023)  Transportation Needs: No Transportation Needs (08/23/2023)  Utilities: Not At Risk (08/23/2023)  Social Connections: Socially Integrated (08/23/2023)  Tobacco Use: Low Risk  (08/23/2023)     Readmission Risk Interventions     No data to display

## 2023-08-24 NOTE — Discharge Summary (Signed)
 Physician Discharge Summary  Patient ID: Allen Sanchez MRN: [id number] DOB/AGE: 1945-11-06 78 y.o.  Admit date: 08/23/2023 Discharge date: 08/24/2023  Admission Diagnoses:  Primary osteoarthritis of left knee  Discharge Diagnoses:  Principal Problem:   Primary osteoarthritis of left knee   Past Medical History:  Diagnosis Date   Aortic stenosis    severe in 03/2023   Arthritis    knee   Diabetes mellitus without complication (HCC)    Dysrhythmia    Eczema    ED (erectile dysfunction)    GERD (gastroesophageal reflux disease)    Horseshoe kidney    Hyperlipidemia    Neuromuscular disorder (HCC)    Sciatic nerve pain greater left -under physical therapy now   PAC (premature atrial contraction)     Surgeries: Procedure(s): ARTHROPLASTY, KNEE, TOTAL on 08/23/2023   Consultants (if any):   Discharged Condition: Improved  Hospital Course: Allen Sanchez is an 78 y.o. male who was admitted 08/23/2023 with a diagnosis of Primary osteoarthritis of left knee and went to the operating room on 08/23/2023 and underwent the above named procedures.    He was given perioperative antibiotics:  Anti-infectives (From admission, onward)    Start     Dose/Rate Route Frequency Ordered Stop   08/23/23 1800  ceFAZolin  (ANCEF ) IVPB 2g/100 mL premix        2 g 200 mL/hr over 30 Minutes Intravenous Every 6 hours 08/23/23 1642 08/24/23 0035   08/23/23 0900  ceFAZolin  (ANCEF ) IVPB 2g/100 mL premix        2 g 200 mL/hr over 30 Minutes Intravenous On call to O.R. 08/23/23 0848 08/23/23 1227     .  He was given sequential compression devices, early ambulation, and aspirin  for DVT prophylaxis.  He benefited maximally from the hospital stay and there were no complications.    Recent vital signs:  Vitals:   08/24/23 0219 08/24/23 0642  BP: 111/78 108/73  Pulse: 77 (!) 51  Resp: 18 18  Temp: 97.7 F (36.5 C) 98 F (36.7 C)  SpO2: 99% 100%    Recent laboratory studies:  Lab Results   Component Value Date   HGB 10.8 (L) 08/24/2023   HGB 11.4 (L) 08/23/2023   HGB 12.7 (L) 08/11/2023   Lab Results  Component Value Date   WBC 6.6 08/24/2023   PLT 101 (L) 08/24/2023   No results found for: INR Lab Results  Component Value Date   NA 134 (L) 08/24/2023   K 4.5 08/24/2023   CL 107 08/24/2023   CO2 20 (L) 08/24/2023   BUN <5 (L) 08/24/2023   CREATININE 0.81 08/24/2023   GLUCOSE 126 (H) 08/24/2023    Discharge Medications:   Allergies as of 08/24/2023       Reactions   Crestor  [rosuvastatin ] Other (See Comments)   muscle weakness    Lipitor [atorvastatin] Other (See Comments)   muscle weakness    Pravastatin Other (See Comments)   myalgias   Zetia [ezetimibe] Other (See Comments)   muscle weakness         Medication List     STOP taking these medications    acetaminophen  650 MG CR tablet Commonly known as: TYLENOL  Replaced by: acetaminophen  500 MG tablet   diclofenac 75 MG EC tablet Commonly known as: VOLTAREN       TAKE these medications    acetaminophen  500 MG tablet Commonly known as: TYLENOL  Take 2 tablets (1,000 mg total) by mouth every 8 (eight) hours as  needed. Replaces: acetaminophen  650 MG CR tablet   aspirin  EC 81 MG tablet Take 1 tablet (81 mg total) by mouth 2 (two) times daily for 28 days. Swallow whole.   bisacodyl 5 MG EC tablet Commonly known as: DULCOLAX Take 5-10 mg by mouth daily as needed (constipation.).   brimonidine  0.2 % ophthalmic solution Commonly known as: ALPHAGAN  Place 1 drop into both eyes in the morning and at bedtime.   calcium  carbonate 500 MG chewable tablet Commonly known as: TUMS - dosed in mg elemental calcium  Chew 1-2 tablets by mouth 3 (three) times daily as needed for indigestion or heartburn.   celecoxib  100 MG capsule Commonly known as: CeleBREX  Take 1 capsule (100 mg total) by mouth 2 (two) times daily for 14 days.   Cialis 20 MG tablet Generic drug: tadalafil Take 20 mg by mouth  daily as needed for erectile dysfunction.   dorzolamide -timolol  2-0.5 % ophthalmic solution Commonly known as: COSOPT  Place 1 drop into both eyes 2 (two) times daily.   FISH OIL PO Take 1,000 mg by mouth in the morning.   hydrocortisone  2.5 % cream Apply 1 Application topically daily as needed (eczema).   latanoprost  0.005 % ophthalmic solution Commonly known as: XALATAN  Place 1 drop into both eyes at bedtime.   methocarbamol  500 MG tablet Commonly known as: ROBAXIN  Take 1 tablet (500 mg total) by mouth every 8 (eight) hours as needed for up to 10 days for muscle spasms.   multivitamin with minerals Tabs tablet Take 1 tablet by mouth in the morning. Centrum Silver   omeprazole  40 MG capsule Commonly known as: PRILOSEC Take 1 capsule (40 mg total) by mouth daily for 21 days.   ondansetron  4 MG tablet Commonly known as: Zofran  Take 1 tablet (4 mg total) by mouth every 8 (eight) hours as needed for up to 14 days for nausea or vomiting.   oxyCODONE  5 MG immediate release tablet Commonly known as: Roxicodone  Take 1 tablet (5 mg total) by mouth every 4 (four) hours as needed for up to 7 days for severe pain (pain score 7-10) or moderate pain (pain score 4-6).   polyethylene glycol 17 g packet Commonly known as: MiraLax  Take 17 g by mouth daily.   primidone  50 MG tablet Commonly known as: MYSOLINE  Take 50 mg by mouth daily at 6 PM.   rosuvastatin  40 MG tablet Commonly known as: CRESTOR  TAKE 1 TABLET(40 MG) BY MOUTH DAILY   triamcinolone  cream 0.1 % Commonly known as: KENALOG Apply 1 Application topically 2 (two) times daily as needed (eczema).        Diagnostic Studies: DG Knee Left Port Result Date: 08/23/2023 CLINICAL DATA:  Left knee arthroplasty. EXAM: PORTABLE LEFT KNEE - 1-2 VIEW COMPARISON:  None Available. FINDINGS: Left knee arthroplasty in expected alignment. No periprosthetic lucency or fracture. There has been patellar resurfacing. Recent postsurgical  change includes air and edema in the soft tissues and joint space. IMPRESSION: Left knee arthroplasty without immediate postoperative complication. Electronically Signed   By: Andrea Gasman M.D.   On: 08/23/2023 15:50    Disposition: Discharge disposition: 01-Home or Self Care       Discharge Instructions     Call MD / Call 911   Complete by: As directed    If you experience chest pain or shortness of breath, CALL 911 and be transported to the hospital emergency room.  If you develope a fever above 101 F, pus (white drainage) or increased drainage or redness  at the wound, or calf pain, call your surgeon's office.   Constipation Prevention   Complete by: As directed    Drink plenty of fluids.  Prune juice may be helpful.  You may use a stool softener, such as Colace (over the counter) 100 mg twice a day.  Use MiraLax  (over the counter) for constipation as needed.   Diet - low sodium heart healthy   Complete by: As directed    Increase activity slowly as tolerated   Complete by: As directed    Post-operative opioid taper instructions:   Complete by: As directed    POST-OPERATIVE OPIOID TAPER INSTRUCTIONS: It is important to wean off of your opioid medication as soon as possible. If you do not need pain medication after your surgery it is ok to stop day one. Opioids include: Codeine, Hydrocodone(Norco, Vicodin), Oxycodone (Percocet, oxycontin ) and hydromorphone  amongst others.  Long term and even short term use of opiods can cause: Increased pain response Dependence Constipation Depression Respiratory depression And more.  Withdrawal symptoms can include Flu like symptoms Nausea, vomiting And more Techniques to manage these symptoms Hydrate well Eat regular healthy meals Stay active Use relaxation techniques(deep breathing, meditating, yoga) Do Not substitute Alcohol  to help with tapering If you have been on opioids for less than two weeks and do not have pain than it is  ok to stop all together.  Plan to wean off of opioids This plan should start within one week post op of your joint replacement. Maintain the same interval or time between taking each dose and first decrease the dose.  Cut the total daily intake of opioids by one tablet each day Next start to increase the time between doses. The last dose that should be eliminated is the evening dose.           Follow-up Information     Edna Toribio LABOR, MD Follow up in 2 week(s).   Specialty: Orthopedic Surgery Contact information: [address] [place] [address] [phone]                    Discharge Instructions      INSTRUCTIONS AFTER JOINT REPLACEMENT   Remove items at home which could result in a fall. This includes throw rugs or furniture in walking pathways ICE to the affected joint every three hours while awake for 30 minutes at a time, for at least the first 3-5 days, and then as needed for pain and swelling.  Continue to use ice for pain and swelling. You may notice swelling that will progress down to the foot and ankle.  This is normal after surgery.  Elevate your leg when you are not up walking on it.   Continue to use the breathing machine you got in the hospital (incentive spirometer) which will help keep your temperature down.  It is common for your temperature to cycle up and down following surgery, especially at night when you are not up moving around and exerting yourself.  The breathing machine keeps your lungs expanded and your temperature down.  DIET:  As you were doing prior to hospitalization, we recommend a well-balanced diet.  DRESSING / WOUND CARE / SHOWERING:  Keep the surgical dressing until follow up.  The dressing is water  proof, so you can shower without any extra covering.  IF THE DRESSING FALLS OFF or the wound gets wet inside, change the dressing with sterile gauze.  Please use good hand washing techniques before changing  the  dressing.  Do not use any lotions or creams on the incision until instructed by your surgeon.    ACTIVITY  Increase activity slowly as tolerated, but follow the weight bearing instructions below.   No driving for 6 weeks or until further direction given by your physician.  You cannot drive while taking narcotics.  No lifting or carrying greater than 10 lbs. until further directed by your surgeon. Avoid periods of inactivity such as sitting longer than an hour when not asleep. This helps prevent blood clots.  You may return to work once you are authorized by your doctor.   WEIGHT BEARING: Weight bearing as tolerated with assist device (walker, cane, etc) as directed, use it as long as suggested by your surgeon or therapist, typically at least 4-6 weeks.  EXERCISES  Results after joint replacement surgery are often greatly improved when you follow the exercise, range of motion and muscle strengthening exercises prescribed by your doctor. Safety measures are also important to protect the joint from further injury. Any time any of these exercises cause you to have increased pain or swelling, decrease what you are doing until you are comfortable again and then slowly increase them. If you have problems or questions, call your caregiver or physical therapist for advice.   Rehabilitation is important following a joint replacement. After just a few days of immobilization, the muscles of the leg can become weakened and shrink (atrophy).  These exercises are designed to build up the tone and strength of the thigh and leg muscles and to improve motion. Often times heat used for twenty to thirty minutes before working out will loosen up your tissues and help with improving the range of motion but do not use heat for the first two weeks following surgery (sometimes heat can increase post-operative swelling).   These exercises can be done on a training (exercise) mat, on the floor, on a table or on a bed. Use  whatever works the best and is most comfortable for you.    Use music or television while you are exercising so that the exercises are a pleasant break in your day. This will make your life better with the exercises acting as a break in your routine that you can look forward to.   Perform all exercises about fifteen times, three times per day or as directed.  You should exercise both the operative leg and the other leg as well.  Exercises include:   Quad Sets - Tighten up the muscle on the front of the thigh (Quad) and hold for 5-10 seconds.   Straight Leg Raises - With your knee straight (if you were given a brace, keep it on), lift the leg to 60 degrees, hold for 3 seconds, and slowly lower the leg.  Perform this exercise against resistance later as your leg gets stronger.  Leg Slides: Lying on your back, slowly slide your foot toward your buttocks, bending your knee up off the floor (only go as far as is comfortable). Then slowly slide your foot back down until your leg is flat on the floor again.  Angel Wings: Lying on your back spread your legs to the side as far apart as you can without causing discomfort.  Hamstring Strength:  Lying on your back, push your heel against the floor with your leg straight by tightening up the muscles of your buttocks.  Repeat, but this time bend your knee to a comfortable angle, and push your heel against the floor.  You may put a pillow under the heel to make it more comfortable if necessary.   A rehabilitation program following joint replacement surgery can speed recovery and prevent re-injury in the future due to weakened muscles. Contact your doctor or a physical therapist for more information on knee rehabilitation.   CONSTIPATION:  Constipation is defined medically as fewer than three stools per week and severe constipation as less than one stool per week.  Even if you have a regular bowel pattern at home, your normal regimen is likely to be disrupted due to  multiple reasons following surgery.  Combination of anesthesia, postoperative narcotics, change in appetite and fluid intake all can affect your bowels.   YOU MUST use at least one of the following options; they are listed in order of increasing strength to get the job done.  They are all available over the counter, and you may need to use some, POSSIBLY even all of these options:    Drink plenty of fluids (prune juice may be helpful) and high fiber foods Colace 100 mg by mouth twice a day  Senokot for constipation as directed and as needed Dulcolax (bisacodyl), take with full glass of water   Miralax  (polyethylene glycol) once or twice a day as needed.  If you have tried all these things and are unable to have a bowel movement in the first 3-4 days after surgery call either your surgeon or your primary doctor.    If you experience loose stools or diarrhea, hold the medications until you stool forms back up.  If your symptoms do not get better within 1 week or if they get worse, check with your doctor.  If you experience the worst abdominal pain ever or develop nausea or vomiting, please contact the office immediately for further recommendations for treatment.  ITCHING:  If you experience itching with your medications, try taking only a single pain pill, or even half a pain pill at a time.  You can also use Benadryl  over the counter for itching or also to help with sleep.   TED HOSE STOCKINGS:  Use stockings on both legs until for at least 2 weeks or as directed by physician office. They may be removed at night for sleeping.  MEDICATIONS:  See your medication summary on the "After Visit Summary" that nursing will review with you.  You may have some home medications which will be placed on hold until you complete the course of blood thinner medication.  It is important for you to complete the blood thinner medication as prescribed.  Blood clot prevention (DVT Prophylaxis): After surgery you are at  an increased risk for a blood clot.  You were prescribed a blood thinner, Aspirin  81mg , to be taken twice daily for a total of 4 weeks from surgery to help reduce your risk of getting a blood clot.  Signs of a pulmonary embolus (blood clot in the lungs) include sudden short of breath, feeling lightheaded or dizzy, chest pain with a deep breath, rapid pulse rapid breathing.  Signs of a blood clot in your arms or legs include new unexplained swelling and cramping, warm, red or darkened skin around the painful area.  Please call the office or 911 right away if these signs or symptoms develop.  PRECAUTIONS:   If you experience chest pain or shortness of breath - call 911 immediately for transfer to the hospital emergency department.   If you develop a fever greater that 101 F, purulent drainage from wound, increased  redness or drainage from wound, foul odor from the wound/dressing, or calf pain - CONTACT YOUR SURGEON.                                                   FOLLOW-UP APPOINTMENTS:  If you do not already have a post-op appointment, please call the office for an appointment to be seen by your surgeon.  Guidelines for how soon to be seen are listed in your "After Visit Summary", but are typically between 2-3 weeks after surgery.  If you have a specialized bandage, you may be told to follow up 1 week after surgery.  OTHER INSTRUCTIONS:  Knee Replacement:  Do not place pillow under knee, focus on keeping the knee straight while resting.  Place foam block, curve side up under heel at all times except when walking.  DO NOT modify, tear, cut, or change the foam block in any way.  POST-OPERATIVE OPIOID TAPER INSTRUCTIONS: It is important to wean off of your opioid medication as soon as possible. If you do not need pain medication after your surgery it is ok to stop day one. Opioids include: Codeine, Hydrocodone(Norco, Vicodin), Oxycodone (Percocet, oxycontin ) and hydromorphone  amongst others.  Long term  and even short term use of opiods can cause: Increased pain response Dependence Constipation Depression Respiratory depression And more.  Withdrawal symptoms can include Flu like symptoms Nausea, vomiting And more Techniques to manage these symptoms Hydrate well Eat regular healthy meals Stay active Use relaxation techniques(deep breathing, meditating, yoga) Do Not substitute Alcohol  to help with tapering If you have been on opioids for less than two weeks and do not have pain than it is ok to stop all together.  Plan to wean off of opioids This plan should start within one week post op of your joint replacement. Maintain the same interval or time between taking each dose and first decrease the dose.  Cut the total daily intake of opioids by one tablet each day Next start to increase the time between doses. The last dose that should be eliminated is the evening dose.   MAKE SURE YOU:  Understand these instructions.  Get help right away if you are not doing well or get worse.    Thank you for letting us  be a part of your medical care team.  It is a privilege we respect greatly.  We hope these instructions will help you stay on track for a fast and full recovery!            Signed: Lexiana Spindel A Xylon Croom 08/24/2023, 6:59 AM

## 2023-08-24 NOTE — Progress Notes (Signed)
     Subjective:  Patient reports pain as mild.  Doing well this morning.  Plan for mobilization with physical therapy today.  Patient hopeful to discharge today.  Denies distal numbness and tingling.  Yesterday's total administered Morphine Milligram Equivalents: 70   Objective:   VITALS:   Vitals:   08/23/23 1729 08/23/23 2237 08/24/23 0219 08/24/23 0642  BP: (!) 154/88 129/82 111/78 108/73  Pulse: 75 84 77 (!) 51  Resp: 18 18 18 18   Temp:  98.1 F (36.7 C) 97.7 F (36.5 C) 98 F (36.7 C)  TempSrc:  Oral Oral Oral  SpO2: 99% 100% 99% 100%  Weight:      Height:        Sensation intact distally Intact pulses distally Dorsiflexion/Plantar flexion intact Incision: dressing C/D/I Compartment soft   Lab Results  Component Value Date   WBC 6.6 08/24/2023   HGB 10.8 (L) 08/24/2023   HCT 35.2 (L) 08/24/2023   MCV 89.1 08/24/2023   PLT 101 (L) 08/24/2023   BMET    Component Value Date/Time   NA 134 (L) 08/24/2023 0328   K 4.5 08/24/2023 0328   CL 107 08/24/2023 0328   CO2 20 (L) 08/24/2023 0328   GLUCOSE 126 (H) 08/24/2023 0328   BUN <5 (L) 08/24/2023 0328   CREATININE 0.81 08/24/2023 0328   CALCIUM  9.2 08/24/2023 0328   GFRNONAA >60 08/24/2023 0328      Xray: TKA components in good position no adverse features  Assessment/Plan: 1 Day Post-Op   Principal Problem:   Primary osteoarthritis of left knee  S/p L TKA 08/23/23  Post op recs: WB: WBAT Abx: ancef  Imaging: PACU xrays DVT prophylaxis: Aspirin  81mg  BID x4 weeks Follow up: 2 weeks after surgery for a wound check with Dr. Edna at [facility].  Address: [address], [place], [place] [address]  Office Phone: [phone]   TORIBIO DELENA EDNA 08/24/2023, 6:57 AM   TORIBIO Edna, MD  Contact information:   [phone] 7am-5pm epic message Dr. Edna, or call office for patient follow up: [phone] After hours and holidays please check [email or web address] for  group call information for [facility]

## 2023-08-24 NOTE — Plan of Care (Signed)
   Problem: Coping: Goal: Level of anxiety will decrease Outcome: Progressing   Problem: Pain Managment: Goal: General experience of comfort will improve and/or be controlled Outcome: Progressing   Problem: Safety: Goal: Ability to remain free from injury will improve Outcome: Progressing

## 2023-08-24 NOTE — Evaluation (Signed)
 Physical Therapy Evaluation Patient Details Name: Allen Sanchez MRN: [id number] DOB: Apr 15, 1945 Today's Date: 08/24/2023  History of Present Illness  78 yo male s/p L TKA on 08/23/23. PMHx: DM, HTN, HLD  Clinical Impression  Pt is s/p TKA resulting in the deficits listed below (see PT Problem List). Pt in bed, agreeable to session. He is able to come to sit EOB with supervision assist using cues for sequencing but no physical assist required. Pt does benefit from CGA during Sit to Stand with walker due to inc time and forward lean but maintains balance and stability. Amb x155ft at supervision A with RW. Pt returns to chair and reviews HEP and direction until next bout of PT. Pt will benefit from acute skilled PT to increase their independence and safety with mobility to allow discharge.          If plan is discharge home, recommend the following: A little help with walking and/or transfers;A little help with bathing/dressing/bathroom;Assistance with cooking/housework;Help with stairs or ramp for entrance;Assist for transportation   Can travel by private vehicle        Equipment Recommendations None recommended by PT  Recommendations for Other Services       Functional Status Assessment Patient has had a recent decline in their functional status and demonstrates the ability to make significant improvements in function in a reasonable and predictable amount of time.     Precautions / Restrictions Precautions Precautions: Fall Restrictions Weight Bearing Restrictions Per Provider Order: Yes LLE Weight Bearing Per Provider Order: Weight bearing as tolerated      Mobility  Bed Mobility Overal bed mobility: Needs Assistance Bed Mobility: Sit to Supine       Sit to supine: Supervision   General bed mobility comments: increased time and cues for sequencing    Transfers Overall transfer level: Needs assistance Equipment used: Rolling walker (2 wheels) Transfers: Sit to/from  Stand Sit to Stand: Contact guard assist           General transfer comment: increased time and L lean bias    Ambulation/Gait Ambulation/Gait assistance: Supervision Gait Distance (Feet): 120 Feet Assistive device: Rolling walker (2 wheels) Gait Pattern/deviations: Step-to pattern, Antalgic, Wide base of support Gait velocity: dec     General Gait Details: Pt amb with wide BOS, step to pattern with RW noting inc weight shift bilaterally, pt reports that stiffness improves with amb over this distance  Stairs            Wheelchair Mobility     Tilt Bed    Modified Rankin (Stroke Patients Only)       Balance Overall balance assessment: Needs assistance Sitting-balance support: Feet supported Sitting balance-Leahy Scale: Good     Standing balance support: Single extremity supported, Reliant on assistive device for balance Standing balance-Leahy Scale: Fair                               Pertinent Vitals/Pain Pain Assessment Pain Assessment: 0-10 Pain Score: 3  Pain Location: L thigh Pain Descriptors / Indicators: Aching, Constant, Discomfort, Dull Pain Intervention(s): Limited activity within patient's tolerance, Monitored during session, Repositioned, Ice applied    Home Living Family/patient expects to be discharged to:: Private residence Living Arrangements: Spouse/significant other;Children Available Help at Discharge: Family Type of Home: Apartment Home Access: Level entry       Home Layout: One level Home Equipment: Cane - single Librarian, academic (2 wheels)  Prior Function Prior Level of Function : Independent/Modified Independent                     Extremity/Trunk Assessment   Upper Extremity Assessment Upper Extremity Assessment: Overall WFL for tasks assessed    Lower Extremity Assessment Lower Extremity Assessment: Generalized weakness;LLE deficits/detail LLE Deficits / Details: limited ROM and strength  consistent with post operative status    Cervical / Trunk Assessment Cervical / Trunk Assessment: Normal  Communication   Communication Communication: No apparent difficulties    Cognition Arousal: Alert Behavior During Therapy: WFL for tasks assessed/performed   PT - Cognitive impairments: No apparent impairments                         Following commands: Intact       Cueing Cueing Techniques: Verbal cues, Gestural cues, Visual cues     General Comments      Exercises Total Joint Exercises Ankle Circles/Pumps: AROM, Other reps (comment), Both (30) Quad Sets: AROM, 5 reps Hip ABduction/ADduction: AROM, 10 reps   Assessment/Plan    PT Assessment Patient needs continued PT services  PT Problem List Decreased strength;Decreased balance;Decreased range of motion;Decreased mobility;Decreased activity tolerance       PT Treatment Interventions DME instruction;Functional mobility training;Balance training;Patient/family education;Gait training;Therapeutic activities;Therapeutic exercise    PT Goals (Current goals can be found in the Care Plan section)  Acute Rehab PT Goals Patient Stated Goal: improve walking tolerance and return home PT Goal Formulation: With patient Time For Goal Achievement: 09/07/23 Potential to Achieve Goals: Good    Frequency 7X/week     Co-evaluation               AM-PAC PT 6 Clicks Mobility  Outcome Measure Help needed turning from your back to your side while in a flat bed without using bedrails?: A Little Help needed moving from lying on your back to sitting on the side of a flat bed without using bedrails?: A Little Help needed moving to and from a bed to a chair (including a wheelchair)?: A Little Help needed standing up from a chair using your arms (e.g., wheelchair or bedside chair)?: A Little Help needed to walk in hospital room?: A Little Help needed climbing 3-5 steps with a railing? : A Little 6 Click Score:  18    End of Session Equipment Utilized During Treatment: Gait belt Activity Tolerance: Patient tolerated treatment well Patient left: in chair;with call bell/phone within reach;with chair alarm set Nurse Communication: Mobility status PT Visit Diagnosis: Muscle weakness (generalized) (M62.81);Difficulty in walking, not elsewhere classified (R26.2);Pain Pain - Right/Left: Left Pain - part of body: Knee    Time: 9049-8977 PT Time Calculation (min) (ACUTE ONLY): 32 min   Charges:   PT Evaluation $PT Eval Low Complexity: 1 Low PT Treatments $Gait Training: 8-22 mins PT General Charges $$ ACUTE PT VISIT: 1 Visit         Stann, PT Acute Rehabilitation Services Office: [phone] 08/24/2023   Stann DELENA Ohara 08/24/2023, 10:30 AM

## 2023-08-25 DIAGNOSIS — Z4789 Encounter for other orthopedic aftercare: Secondary | ICD-10-CM | POA: Diagnosis not present

## 2023-08-26 DIAGNOSIS — Z4789 Encounter for other orthopedic aftercare: Secondary | ICD-10-CM | POA: Diagnosis not present

## 2023-08-27 LAB — HEMOGLOBIN A1C
Hgb A1c MFr Bld: 5.6 % (ref 4.8–5.6)
Mean Plasma Glucose: 114 mg/dL

## 2023-08-30 DIAGNOSIS — Z96653 Presence of artificial knee joint, bilateral: Secondary | ICD-10-CM | POA: Diagnosis not present

## 2023-08-30 DIAGNOSIS — Z604 Social exclusion and rejection: Secondary | ICD-10-CM | POA: Diagnosis not present

## 2023-08-30 DIAGNOSIS — Z7982 Long term (current) use of aspirin: Secondary | ICD-10-CM | POA: Diagnosis not present

## 2023-08-30 DIAGNOSIS — E119 Type 2 diabetes mellitus without complications: Secondary | ICD-10-CM | POA: Diagnosis not present

## 2023-08-30 DIAGNOSIS — E663 Overweight: Secondary | ICD-10-CM | POA: Diagnosis not present

## 2023-08-30 DIAGNOSIS — Z471 Aftercare following joint replacement surgery: Secondary | ICD-10-CM | POA: Diagnosis not present

## 2023-08-30 DIAGNOSIS — Z6830 Body mass index (BMI) 30.0-30.9, adult: Secondary | ICD-10-CM | POA: Diagnosis not present

## 2023-08-30 DIAGNOSIS — Z79891 Long term (current) use of opiate analgesic: Secondary | ICD-10-CM | POA: Diagnosis not present

## 2023-08-30 DIAGNOSIS — I350 Nonrheumatic aortic (valve) stenosis: Secondary | ICD-10-CM | POA: Diagnosis not present

## 2023-08-30 DIAGNOSIS — Z791 Long term (current) use of non-steroidal anti-inflammatories (NSAID): Secondary | ICD-10-CM | POA: Diagnosis not present

## 2023-08-30 DIAGNOSIS — I491 Atrial premature depolarization: Secondary | ICD-10-CM | POA: Diagnosis not present

## 2023-09-01 DIAGNOSIS — I350 Nonrheumatic aortic (valve) stenosis: Secondary | ICD-10-CM | POA: Diagnosis not present

## 2023-09-01 DIAGNOSIS — I491 Atrial premature depolarization: Secondary | ICD-10-CM | POA: Diagnosis not present

## 2023-09-01 DIAGNOSIS — Z7982 Long term (current) use of aspirin: Secondary | ICD-10-CM | POA: Diagnosis not present

## 2023-09-01 DIAGNOSIS — E663 Overweight: Secondary | ICD-10-CM | POA: Diagnosis not present

## 2023-09-01 DIAGNOSIS — Z604 Social exclusion and rejection: Secondary | ICD-10-CM | POA: Diagnosis not present

## 2023-09-01 DIAGNOSIS — Z791 Long term (current) use of non-steroidal anti-inflammatories (NSAID): Secondary | ICD-10-CM | POA: Diagnosis not present

## 2023-09-01 DIAGNOSIS — E119 Type 2 diabetes mellitus without complications: Secondary | ICD-10-CM | POA: Diagnosis not present

## 2023-09-01 DIAGNOSIS — Z6830 Body mass index (BMI) 30.0-30.9, adult: Secondary | ICD-10-CM | POA: Diagnosis not present

## 2023-09-01 DIAGNOSIS — Z471 Aftercare following joint replacement surgery: Secondary | ICD-10-CM | POA: Diagnosis not present

## 2023-09-01 DIAGNOSIS — Z96653 Presence of artificial knee joint, bilateral: Secondary | ICD-10-CM | POA: Diagnosis not present

## 2023-09-01 DIAGNOSIS — Z79891 Long term (current) use of opiate analgesic: Secondary | ICD-10-CM | POA: Diagnosis not present

## 2023-09-02 DIAGNOSIS — M1712 Unilateral primary osteoarthritis, left knee: Secondary | ICD-10-CM | POA: Diagnosis not present

## 2023-09-03 DIAGNOSIS — Z604 Social exclusion and rejection: Secondary | ICD-10-CM | POA: Diagnosis not present

## 2023-09-03 DIAGNOSIS — E119 Type 2 diabetes mellitus without complications: Secondary | ICD-10-CM | POA: Diagnosis not present

## 2023-09-03 DIAGNOSIS — E663 Overweight: Secondary | ICD-10-CM | POA: Diagnosis not present

## 2023-09-03 DIAGNOSIS — I491 Atrial premature depolarization: Secondary | ICD-10-CM | POA: Diagnosis not present

## 2023-09-03 DIAGNOSIS — Z471 Aftercare following joint replacement surgery: Secondary | ICD-10-CM | POA: Diagnosis not present

## 2023-09-03 DIAGNOSIS — Z791 Long term (current) use of non-steroidal anti-inflammatories (NSAID): Secondary | ICD-10-CM | POA: Diagnosis not present

## 2023-09-03 DIAGNOSIS — Z96653 Presence of artificial knee joint, bilateral: Secondary | ICD-10-CM | POA: Diagnosis not present

## 2023-09-03 DIAGNOSIS — Z79891 Long term (current) use of opiate analgesic: Secondary | ICD-10-CM | POA: Diagnosis not present

## 2023-09-03 DIAGNOSIS — Z7982 Long term (current) use of aspirin: Secondary | ICD-10-CM | POA: Diagnosis not present

## 2023-09-03 DIAGNOSIS — I350 Nonrheumatic aortic (valve) stenosis: Secondary | ICD-10-CM | POA: Diagnosis not present

## 2023-09-03 DIAGNOSIS — Z6830 Body mass index (BMI) 30.0-30.9, adult: Secondary | ICD-10-CM | POA: Diagnosis not present

## 2023-09-05 DIAGNOSIS — M1711 Unilateral primary osteoarthritis, right knee: Secondary | ICD-10-CM | POA: Diagnosis not present

## 2023-09-05 DIAGNOSIS — E119 Type 2 diabetes mellitus without complications: Secondary | ICD-10-CM | POA: Diagnosis not present

## 2023-09-05 DIAGNOSIS — E7800 Pure hypercholesterolemia, unspecified: Secondary | ICD-10-CM | POA: Diagnosis not present

## 2023-09-05 DIAGNOSIS — I350 Nonrheumatic aortic (valve) stenosis: Secondary | ICD-10-CM | POA: Diagnosis not present

## 2023-09-07 DIAGNOSIS — M6281 Muscle weakness (generalized): Secondary | ICD-10-CM | POA: Diagnosis not present

## 2023-09-07 DIAGNOSIS — M1712 Unilateral primary osteoarthritis, left knee: Secondary | ICD-10-CM | POA: Diagnosis not present

## 2023-09-07 DIAGNOSIS — M25662 Stiffness of left knee, not elsewhere classified: Secondary | ICD-10-CM | POA: Diagnosis not present

## 2023-09-07 DIAGNOSIS — R262 Difficulty in walking, not elsewhere classified: Secondary | ICD-10-CM | POA: Diagnosis not present

## 2023-09-15 DIAGNOSIS — R262 Difficulty in walking, not elsewhere classified: Secondary | ICD-10-CM | POA: Diagnosis not present

## 2023-09-15 DIAGNOSIS — M25662 Stiffness of left knee, not elsewhere classified: Secondary | ICD-10-CM | POA: Diagnosis not present

## 2023-09-15 DIAGNOSIS — M1712 Unilateral primary osteoarthritis, left knee: Secondary | ICD-10-CM | POA: Diagnosis not present

## 2023-09-15 DIAGNOSIS — M6281 Muscle weakness (generalized): Secondary | ICD-10-CM | POA: Diagnosis not present

## 2023-09-17 IMAGING — CT CT CARDIAC CORONARY ARTERY CALCIUM SCORE
3 series · 14 of 20 positions shown, 16 images · non-contrast
Comparison: None.
COMPARISON: None.

Addendum:
EXAM:
OVER-READ INTERPRETATION  CT CHEST

The following report is an over-read performed by radiologist Dr.
Efren Funk [REDACTED] on 10/29/2020. This
over-read does not include interpretation of cardiac or coronary
anatomy or pathology. The coronary calcium score interpretation by
the cardiologist is attached.
CLINICAL DATA: 75M for cardiovascular disease risk stratification
Coronary Calcium Score
TECHNIQUE: A gated, non-contrast computed tomography scan of the heart was
performed using 3mm slice thickness. Axial images were analyzed on a
dedicated workstation. Calcium scoring of the coronary arteries was
performed using the Agatston method.

[Series 2: cascseq 2.0 sa36 (id) (id) · axial · 0.43mm/px · z∈[-178,-108]mm · 4 of 59 slices shown]
[im 12/59  vessel]
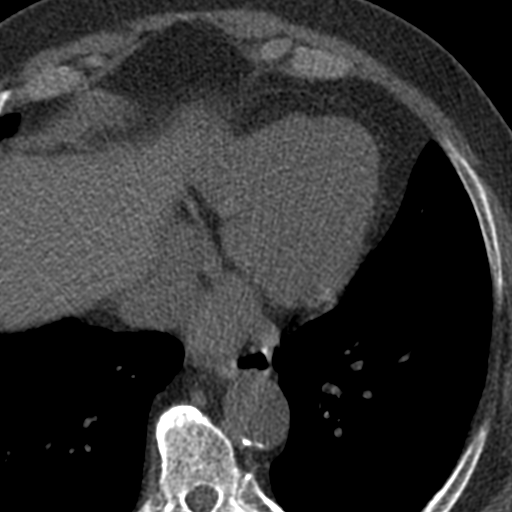
[im 24/59  vessel]
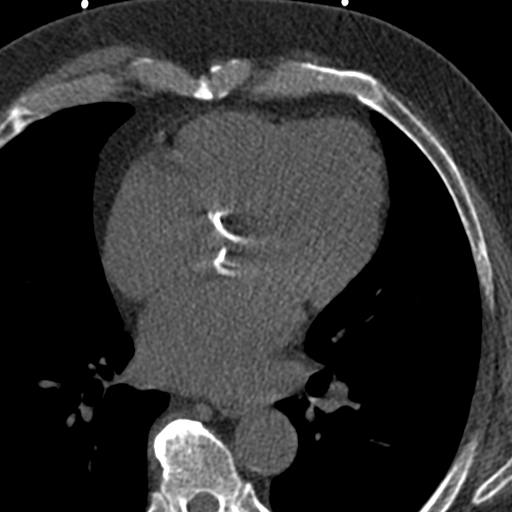
[im 35/59  vessel]
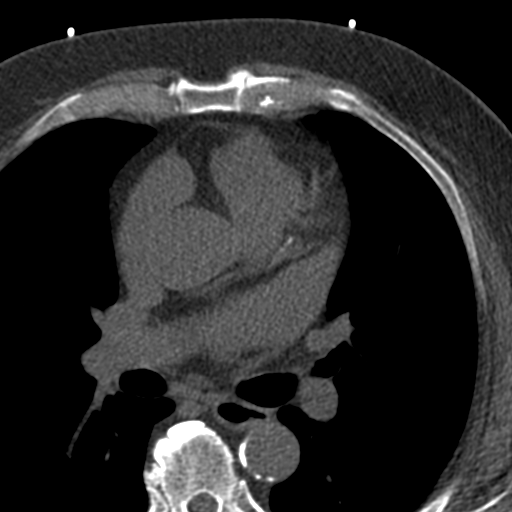
[im 47/59  vessel]
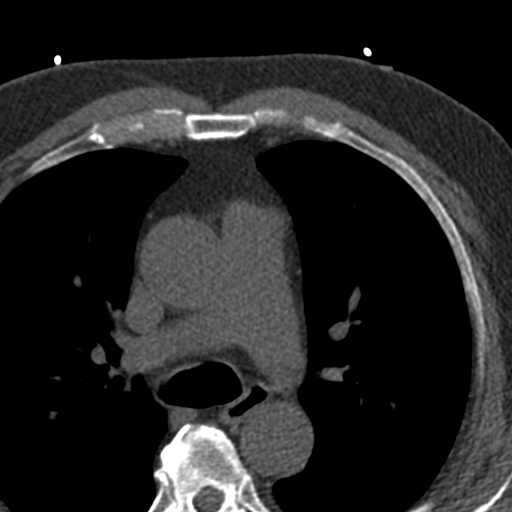

[Series 3: cascseq 2.0 bf37 st · axial · 0.67mm/px · z∈[-182,-104]mm · 5 of 59 slices shown, 7 images]
[im 10/59  vessel]
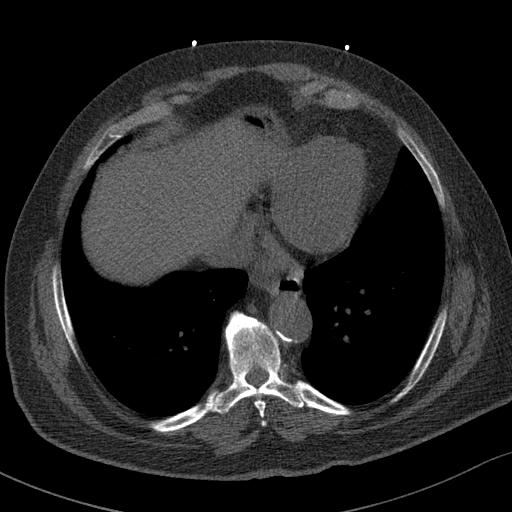
[im 10/59  lung]
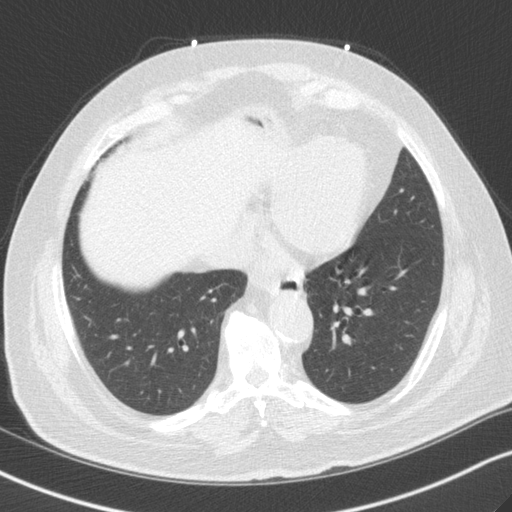
[im 20/59  vessel]
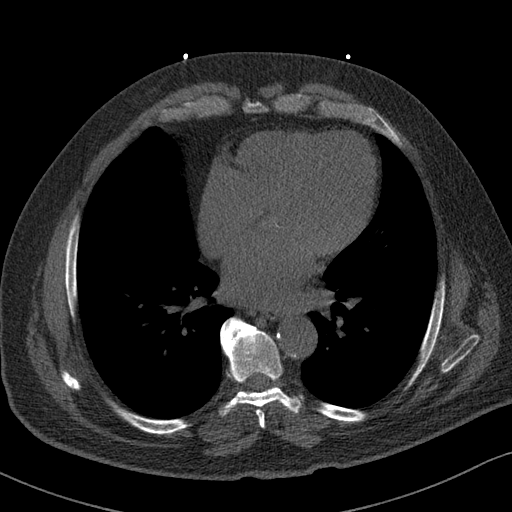
[im 30/59  vessel]
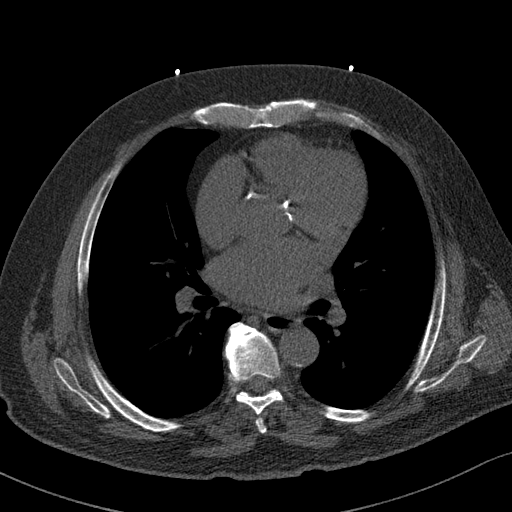
[im 39/59  vessel]
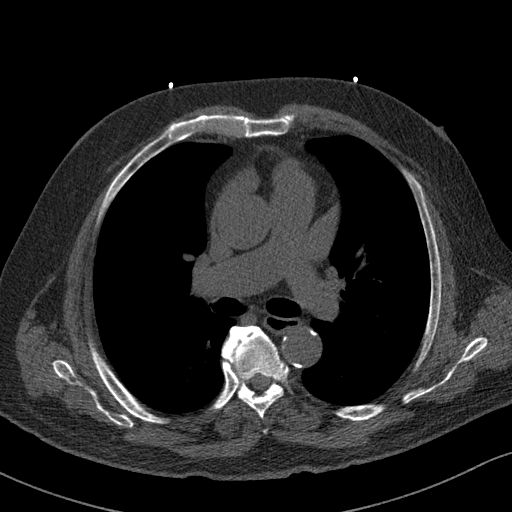
[im 49/59  vessel]
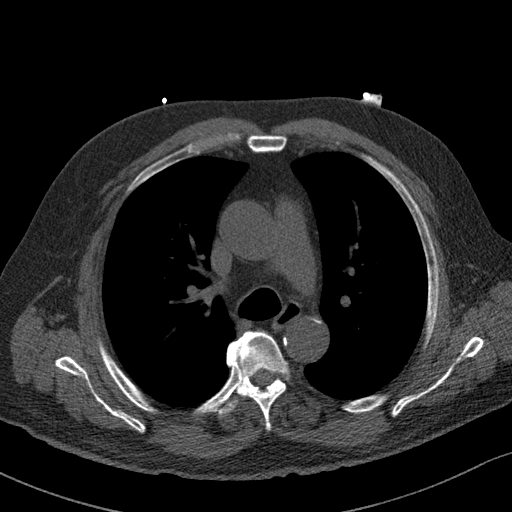
[im 49/59  lung]
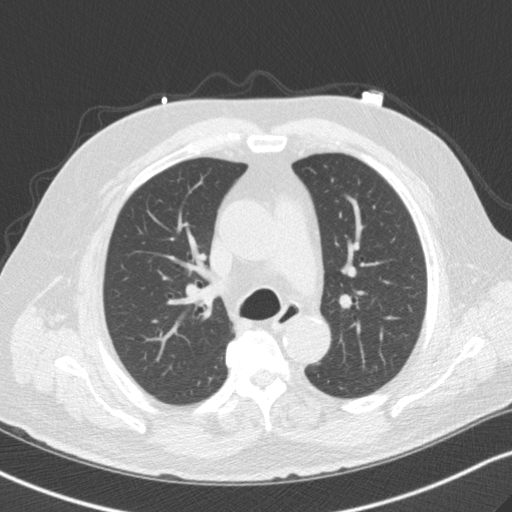

[Series 4: cascseq 2.0 br59 lung · axial · 0.67mm/px · z∈[-182,-104]mm · 5 of 59 slices shown]
[im 10/59  lung]
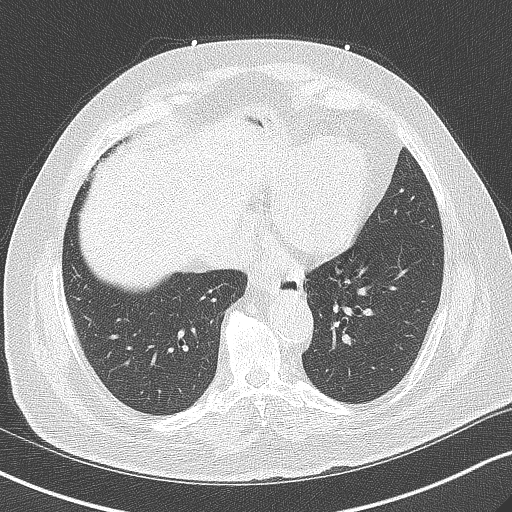
[im 20/59  lung]
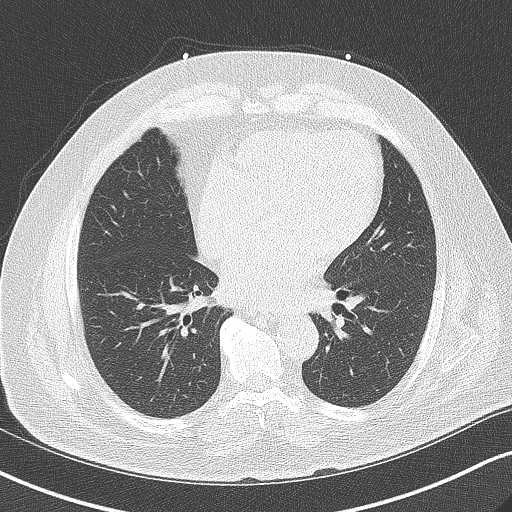
[im 30/59  lung]
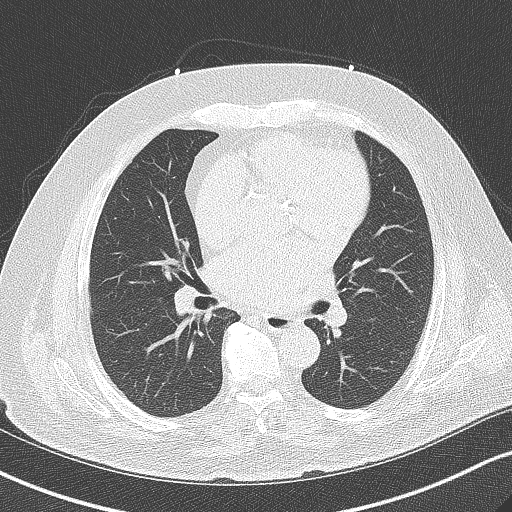
[im 39/59  lung]
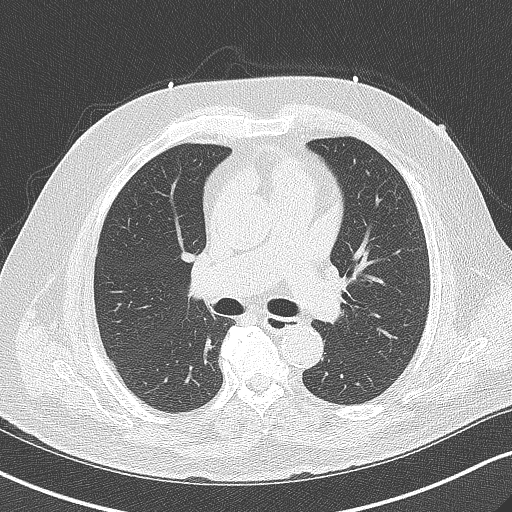
[im 49/59  lung]
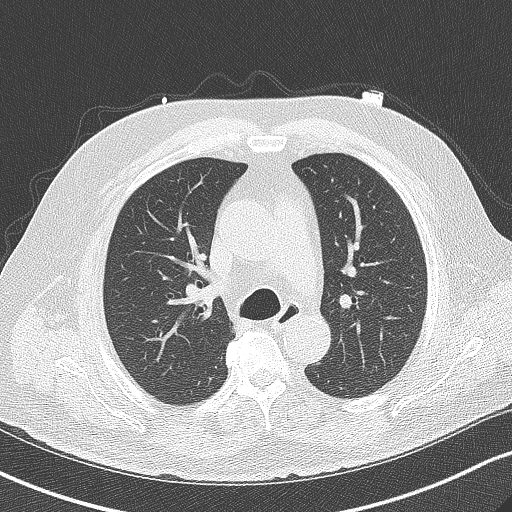

[14 of 20 positions shown; findings below may reference images not displayed]

FINDINGS: Vascular: Atherosclerosis of the visualized thoracic aorta without
evidence aneurysmal disease. The aortic valve is heavily calcified.

Mediastinum/Nodes: Visualized mediastinum and hilar regions
demonstrate no evidence of lymphadenopathy or masses.

Lungs/Pleura: Visualized lungs show no evidence of pulmonary edema,
consolidation, pneumothorax, nodule or pleural fluid.

Upper Abdomen: No acute abnormality.

Musculoskeletal: No chest wall mass or suspicious bone lesions
identified.
IMPRESSION: Heavily calcified aortic valve. The patient has had prior
echocardiography documenting aortic valve stenosis.
FINDINGS: Coronary arteries: Normal origins.

Coronary Calcium Score:

Left main: 0

Left anterior descending artery: 112

Left circumflex artery:

Right coronary artery:

Total: 163

Percentile: 62nd

Pericardium: Normal.

Ascending Aorta: Normal caliber.  Aortic atherosclerosis.

Calcification of the aortic valve.

Non-cardiac: See separate report from [REDACTED].
IMPRESSION: Coronary calcium score of 163. This was 62nd percentile for age-,
race-, and sex-matched controls.

Calcification of the aortic valve and the aorta.



If CAC=0, it is reasonable to withhold statin therapy and reassess
in 5 to 10 years, as long as higher risk conditions are absent
(diabetes mellitus, family history of premature CHD in first degree
relatives (males <55 years; females <65 years), cigarette smoking,
or LDL >=190 mg/dL).

If CAC is 1 to 99, it is reasonable to initiate statin therapy for
patients >=55 years of age.

If CAC is >=100 or >=75th percentile, it is reasonable to initiate
statin therapy at any age.

Cardiology referral should be considered for patients with CAC
scores >=400 or >=75th percentile.

*2945 AHA/ACC/AACVPR/AAPA/ABC/JUMPER/DARTORA/CEOLA/Sexton/LIZETTE/KRANZ/KING YEUNG
Guideline on the Management of Blood Cholesterol: A Report of the
American College of Cardiology/American Heart Association Task Force
on Clinical Practice Guidelines. J Am Coll Cardiol.
5487;73(24):8762-8748.

*** End of Addendum ***
EXAM:
OVER-READ INTERPRETATION  CT CHEST

The following report is an over-read performed by radiologist Dr.
Efren Funk [REDACTED] on 10/29/2020. This
over-read does not include interpretation of cardiac or coronary
anatomy or pathology. The coronary calcium score interpretation by
the cardiologist is attached.
FINDINGS: Vascular: Atherosclerosis of the visualized thoracic aorta without
evidence aneurysmal disease. The aortic valve is heavily calcified.

Mediastinum/Nodes: Visualized mediastinum and hilar regions
demonstrate no evidence of lymphadenopathy or masses.

Lungs/Pleura: Visualized lungs show no evidence of pulmonary edema,
consolidation, pneumothorax, nodule or pleural fluid.

Upper Abdomen: No acute abnormality.

Musculoskeletal: No chest wall mass or suspicious bone lesions
identified.
IMPRESSION: Heavily calcified aortic valve. The patient has had prior
echocardiography documenting aortic valve stenosis.

## 2023-09-21 DIAGNOSIS — R262 Difficulty in walking, not elsewhere classified: Secondary | ICD-10-CM | POA: Diagnosis not present

## 2023-09-21 DIAGNOSIS — M25662 Stiffness of left knee, not elsewhere classified: Secondary | ICD-10-CM | POA: Diagnosis not present

## 2023-09-21 DIAGNOSIS — M1712 Unilateral primary osteoarthritis, left knee: Secondary | ICD-10-CM | POA: Diagnosis not present

## 2023-09-21 DIAGNOSIS — M6281 Muscle weakness (generalized): Secondary | ICD-10-CM | POA: Diagnosis not present

## 2023-09-30 DIAGNOSIS — R262 Difficulty in walking, not elsewhere classified: Secondary | ICD-10-CM | POA: Diagnosis not present

## 2023-09-30 DIAGNOSIS — M6281 Muscle weakness (generalized): Secondary | ICD-10-CM | POA: Diagnosis not present

## 2023-09-30 DIAGNOSIS — M1712 Unilateral primary osteoarthritis, left knee: Secondary | ICD-10-CM | POA: Diagnosis not present

## 2023-09-30 DIAGNOSIS — M25662 Stiffness of left knee, not elsewhere classified: Secondary | ICD-10-CM | POA: Diagnosis not present

## 2023-10-04 DIAGNOSIS — M6281 Muscle weakness (generalized): Secondary | ICD-10-CM | POA: Diagnosis not present

## 2023-10-04 DIAGNOSIS — M25662 Stiffness of left knee, not elsewhere classified: Secondary | ICD-10-CM | POA: Diagnosis not present

## 2023-10-04 DIAGNOSIS — M1712 Unilateral primary osteoarthritis, left knee: Secondary | ICD-10-CM | POA: Diagnosis not present

## 2023-10-04 DIAGNOSIS — R262 Difficulty in walking, not elsewhere classified: Secondary | ICD-10-CM | POA: Diagnosis not present

## 2023-10-05 DIAGNOSIS — I350 Nonrheumatic aortic (valve) stenosis: Secondary | ICD-10-CM | POA: Diagnosis not present

## 2023-10-05 DIAGNOSIS — E119 Type 2 diabetes mellitus without complications: Secondary | ICD-10-CM | POA: Diagnosis not present

## 2023-10-05 DIAGNOSIS — E7800 Pure hypercholesterolemia, unspecified: Secondary | ICD-10-CM | POA: Diagnosis not present

## 2023-10-05 DIAGNOSIS — M1712 Unilateral primary osteoarthritis, left knee: Secondary | ICD-10-CM | POA: Diagnosis not present

## 2023-10-05 DIAGNOSIS — M1711 Unilateral primary osteoarthritis, right knee: Secondary | ICD-10-CM | POA: Diagnosis not present

## 2023-10-11 DIAGNOSIS — M1712 Unilateral primary osteoarthritis, left knee: Secondary | ICD-10-CM | POA: Diagnosis not present

## 2023-10-14 DIAGNOSIS — M47816 Spondylosis without myelopathy or radiculopathy, lumbar region: Secondary | ICD-10-CM | POA: Diagnosis not present

## 2023-10-14 DIAGNOSIS — M48061 Spinal stenosis, lumbar region without neurogenic claudication: Secondary | ICD-10-CM | POA: Diagnosis not present

## 2023-10-25 DIAGNOSIS — M1712 Unilateral primary osteoarthritis, left knee: Secondary | ICD-10-CM | POA: Diagnosis not present

## 2023-11-01 DIAGNOSIS — M1712 Unilateral primary osteoarthritis, left knee: Secondary | ICD-10-CM | POA: Diagnosis not present

## 2023-11-05 DIAGNOSIS — E7800 Pure hypercholesterolemia, unspecified: Secondary | ICD-10-CM | POA: Diagnosis not present

## 2023-11-05 DIAGNOSIS — M1711 Unilateral primary osteoarthritis, right knee: Secondary | ICD-10-CM | POA: Diagnosis not present

## 2023-11-05 DIAGNOSIS — E119 Type 2 diabetes mellitus without complications: Secondary | ICD-10-CM | POA: Diagnosis not present

## 2023-11-05 DIAGNOSIS — I350 Nonrheumatic aortic (valve) stenosis: Secondary | ICD-10-CM | POA: Diagnosis not present

## 2023-11-08 DIAGNOSIS — M1712 Unilateral primary osteoarthritis, left knee: Secondary | ICD-10-CM | POA: Diagnosis not present

## 2023-11-09 ENCOUNTER — Other Ambulatory Visit: Payer: Self-pay | Admitting: Cardiovascular Disease

## 2023-11-11 DIAGNOSIS — Z96653 Presence of artificial knee joint, bilateral: Secondary | ICD-10-CM | POA: Diagnosis not present

## 2023-11-25 DIAGNOSIS — Q253 Supravalvular aortic stenosis: Secondary | ICD-10-CM | POA: Diagnosis not present

## 2023-11-25 DIAGNOSIS — Z86018 Personal history of other benign neoplasm: Secondary | ICD-10-CM | POA: Diagnosis not present

## 2023-11-30 DIAGNOSIS — Z96653 Presence of artificial knee joint, bilateral: Secondary | ICD-10-CM | POA: Diagnosis not present

## 2023-12-05 DIAGNOSIS — E119 Type 2 diabetes mellitus without complications: Secondary | ICD-10-CM | POA: Diagnosis not present

## 2023-12-05 DIAGNOSIS — M1711 Unilateral primary osteoarthritis, right knee: Secondary | ICD-10-CM | POA: Diagnosis not present

## 2023-12-05 DIAGNOSIS — I350 Nonrheumatic aortic (valve) stenosis: Secondary | ICD-10-CM | POA: Diagnosis not present

## 2023-12-05 DIAGNOSIS — E7800 Pure hypercholesterolemia, unspecified: Secondary | ICD-10-CM | POA: Diagnosis not present

## 2023-12-16 DIAGNOSIS — Z860101 Personal history of adenomatous and serrated colon polyps: Secondary | ICD-10-CM | POA: Diagnosis not present

## 2023-12-16 DIAGNOSIS — Z09 Encounter for follow-up examination after completed treatment for conditions other than malignant neoplasm: Secondary | ICD-10-CM | POA: Diagnosis not present

## 2023-12-16 DIAGNOSIS — K649 Unspecified hemorrhoids: Secondary | ICD-10-CM | POA: Diagnosis not present

## 2023-12-16 DIAGNOSIS — D123 Benign neoplasm of transverse colon: Secondary | ICD-10-CM | POA: Diagnosis not present

## 2023-12-23 ENCOUNTER — Ambulatory Visit (HOSPITAL_COMMUNITY)
Admission: EM | Admit: 2023-12-23 | Discharge: 2023-12-23 | Disposition: A | Discharge status: Home / Self Care | Attending: Internal Medicine | Admitting: Internal Medicine

## 2023-12-23 ENCOUNTER — Encounter (HOSPITAL_COMMUNITY): Payer: Self-pay

## 2023-12-23 DIAGNOSIS — J069 Acute upper respiratory infection, unspecified: Secondary | ICD-10-CM

## 2023-12-23 LAB — POC COVID19/FLU A&B COMBO
Covid Antigen, POC: NEGATIVE
Influenza A Antigen, POC: NEGATIVE
Influenza B Antigen, POC: NEGATIVE

## 2023-12-23 MED ORDER — BENZONATATE 100 MG PO CAPS: 100.0000 mg | ORAL_CAPSULE | Freq: Three times a day (TID) | ORAL | 0 refills | Status: DC | PRN

## 2023-12-23 NOTE — Discharge Instructions (Addendum)
 You have a viral upper respiratory infection.  COVID-19 and influenza test is negative today.  Symptoms should improve over the next week to 10 days.  If you develop chest pain or shortness of breath, go to the emergency room.  Some things that can make you feel better are: - Increased rest - Increasing fluid with water /sugar free electrolytes - Acetaminophen  and ibuprofen as needed for fever/pain - Salt water  gargling, chloraseptic spray and throat lozenges - OTC guaifenesin  (Mucinex ) 600 mg twice daily for congestion - Saline sinus flushes or a neti pot - Humidifying the air - Claritin without the decongestant

## 2023-12-23 NOTE — ED Triage Notes (Signed)
 Patient reports that he began having a non productive cough and nasal congestion today.  Patient reports that he has taken Claritin-D for his symptoms.

## 2023-12-23 NOTE — ED Provider Notes (Addendum)
 [facility]    CSN: [id number] Arrival date & time: 12/23/23  1354      History   Chief Complaint Chief Complaint  Patient presents with   Cough   Nasal Congestion    HPI Allen Sanchez is a 78 y.o. male.   Patient presents with 1 day history of dry cough, runny nose, decreased appetite, and fatigue.  Also reports intermittent chills and bodyaches.  No fever, cough, shortness of breath or chest pain, stuffy nose, sore throat, headache, ear pain, abdominal pain, nausea/vomiting, or diarrhea.  Reports he was exposed to influenza last week.  Has taken Claritin-D and Tylenol  for symptoms with improvement.    Past Medical History:  Diagnosis Date   Aortic stenosis    severe in 03/2023   Arthritis    knee   Diabetes mellitus without complication (HCC)    Dysrhythmia    Eczema    ED (erectile dysfunction)    GERD (gastroesophageal reflux disease)    Horseshoe kidney    Hyperlipidemia    Neuromuscular disorder (HCC)    Sciatic nerve pain greater left -under physical therapy now   PAC (premature atrial contraction)     Patient Active Problem List   Diagnosis Date Noted   Primary osteoarthritis of left knee 08/23/2023   Primary osteoarthritis of right knee 05/10/2023   Preoperative clearance 03/12/2023   Elevated coronary artery calcium  score 10/21/2021   Moderate aortic stenosis 04/16/2017   Murmur, cardiac 11/13/2013   Overweight 11/13/2013   PAC (premature atrial contraction) 11/13/2013   Hyperlipidemia 11/13/2013   Erectile dysfunction 02/23/2011    Past Surgical History:  Procedure Laterality Date   COLONOSCOPY WITH PROPOFOL  N/A 05/29/2014   Procedure: COLONOSCOPY WITH PROPOFOL ;  Surgeon: Gladis MARLA Louder, MD;  Location: [facility];  Service: Endoscopy;  Laterality: N/A;   TOTAL KNEE ARTHROPLASTY Right 05/10/2023   Procedure: ARTHROPLASTY, KNEE, TOTAL;  Surgeon: Edna Toribio LABOR, MD;  Location: [facility];  Service: Orthopedics;  Laterality: Right;    TOTAL KNEE ARTHROPLASTY Left 08/23/2023   Procedure: ARTHROPLASTY, KNEE, TOTAL;  Surgeon: Edna Toribio LABOR, MD;  Location: [facility];  Service: Orthopedics;  Laterality: Left;       Home Medications    Prior to Admission medications  Medication Sig Start Date End Date Taking? Authorizing Provider  benzonatate  (TESSALON ) 100 MG capsule Take 1 capsule (100 mg total) by mouth 3 (three) times daily as needed for cough. Do not take with alcohol  or while operating or driving heavy machinery 87/81/74  Yes Chandra Raisin A, NP  bisacodyl (DULCOLAX) 5 MG EC tablet Take 5-10 mg by mouth daily as needed (constipation.). Patient not taking: Reported on 12/23/2023    [provider]  brimonidine  (ALPHAGAN ) 0.2 % ophthalmic solution Place 1 drop into both eyes in the morning and at bedtime. 10/17/21   [provider]  calcium  carbonate (TUMS - DOSED IN MG ELEMENTAL CALCIUM ) 500 MG chewable tablet Chew 1-2 tablets by mouth 3 (three) times daily as needed for indigestion or heartburn.    [provider]  CIALIS 20 MG tablet Take 20 mg by mouth daily as needed for erectile dysfunction. 08/25/13   [provider]  dorzolamide -timolol  (COSOPT ) 2-0.5 % ophthalmic solution Place 1 drop into both eyes 2 (two) times daily. 02/11/23   [provider]  hydrocortisone  2.5 % cream Apply 1 Application topically daily as needed (eczema). 03/03/22   [provider]  latanoprost  (XALATAN ) 0.005 % ophthalmic solution Place 1  drop into both eyes at bedtime. 09/02/21   [provider]  Multiple Vitamin (MULTIVITAMIN WITH MINERALS) TABS tablet Take 1 tablet by mouth in the morning. Centrum Silver    [provider]  Omega-3 Fatty Acids (FISH OIL PO) Take 1,000 mg by mouth in the morning.    [provider]  omeprazole  (PRILOSEC) 40 MG capsule Take 1 capsule (40 mg total) by mouth daily for 21 days. 08/23/23 09/13/23  Renae Bernarda HERO, PA-C   polyethylene glycol (MIRALAX ) 17 g packet Take 17 g by mouth daily. Patient not taking: Reported on 12/23/2023 08/23/23   Renae Bernarda HERO, PA-C  primidone  (MYSOLINE ) 50 MG tablet Take 50 mg by mouth daily at 6 PM.    [provider]  rosuvastatin  (CRESTOR ) 40 MG tablet TAKE 1 TABLET(40 MG) BY MOUTH DAILY 11/11/23   Court Dorn PARAS, MD  triamcinolone  cream (KENALOG) 0.1 % Apply 1 Application topically 2 (two) times daily as needed (eczema). 03/18/23   [provider]    Family History Family History  Problem Relation Age of Onset   Aneurysm Mother    Stroke Father     Social History Social History[1]   Allergies   Crestor  [rosuvastatin ], Lipitor [atorvastatin], Pravastatin, and Zetia [ezetimibe]   Review of Systems Review of Systems Per HPI  Physical Exam Triage Vital Signs ED Triage Vitals [12/23/23 1452]  Encounter Vitals Group     BP (!) 157/98     Girls Systolic BP Percentile      Girls Diastolic BP Percentile      Boys Systolic BP Percentile      Boys Diastolic BP Percentile      Pulse Rate 81     Resp 16     Temp 98.3 F (36.8 C)     Temp Source Oral     SpO2 98 %     Weight      Height      Head Circumference      Peak Flow      Pain Score 0     Pain Loc      Pain Education      Exclude from Growth Chart    No data found.  Updated Vital Signs BP (!) 157/98 (BP Location: Left Arm)   Pulse 81   Temp 98.3 F (36.8 C) (Oral)   Resp 16   SpO2 98%   Visual Acuity Right Eye Distance:   Left Eye Distance:   Bilateral Distance:    Right Eye Near:   Left Eye Near:    Bilateral Near:     Physical Exam Vitals and nursing note reviewed.  Constitutional:      General: He is not in acute distress.    Appearance: Normal appearance. He is not ill-appearing or toxic-appearing.  HENT:     Head: Normocephalic and atraumatic.     Right Ear: Tympanic membrane, ear canal and external ear normal.     Left Ear: Tympanic membrane, ear  canal and external ear normal.     Nose: No congestion or rhinorrhea.     Mouth/Throat:     Mouth: Mucous membranes are moist.     Pharynx: Oropharynx is clear. Postnasal drip present. No oropharyngeal exudate or posterior oropharyngeal erythema.  Eyes:     General: No scleral icterus.    Extraocular Movements: Extraocular movements intact.  Cardiovascular:     Rate and Rhythm: Normal rate and regular rhythm.     Heart sounds: Murmur  heard.  Pulmonary:     Effort: Pulmonary effort is normal. No respiratory distress.     Breath sounds: Normal breath sounds. No wheezing, rhonchi or rales.  Musculoskeletal:     Cervical back: Normal range of motion and neck supple.  Lymphadenopathy:     Cervical: No cervical adenopathy.  Skin:    General: Skin is warm and dry.     Coloration: Skin is not jaundiced or pale.     Findings: No erythema or rash.  Neurological:     Mental Status: He is alert and oriented to person, place, and time.  Psychiatric:        Behavior: Behavior is cooperative.      UC Treatments / Results  Labs (all labs ordered are listed, but only abnormal results are displayed) Labs Reviewed  POC COVID19/FLU A&B COMBO    EKG   Radiology No results found.  Procedures Procedures (including critical care time)  Medications Ordered in UC Medications - No data to display  Initial Impression / Assessment and Plan / UC Course  I have reviewed the triage vital signs and the nursing notes.  Pertinent labs & imaging results that were available during my care of the patient were reviewed by me and considered in my medical decision making (see chart for details).   Patient is a very pleasant, well-appearing 78 year old African-American male presenting today for cough and congestion.  Patient is mildly hypertensive in triage, otherwise vital signs are stable.  On exam, he has postnasal drainage, otherwise examination is unremarkable.  COVID-19, influenza testing is  negative today.  Suspect viral upper respiratory infection.  Supportive care discussed, can continue Claritin; recommended against decongestant.  Also can start cough suppressant medication as needed.  ER and return precautions were discussed with the patient.  The patient was given the opportunity to ask questions.  All questions answered to their satisfaction.  The patient is in agreement to this plan.   Final Clinical Impressions(s) / UC Diagnoses   Final diagnoses:  Viral URI with cough     Discharge Instructions      You have a viral upper respiratory infection.  COVID-19 and influenza test is negative today.  Symptoms should improve over the next week to 10 days.  If you develop chest pain or shortness of breath, go to the emergency room.  Some things that can make you feel better are: - Increased rest - Increasing fluid with water /sugar free electrolytes - Acetaminophen  and ibuprofen as needed for fever/pain - Salt water  gargling, chloraseptic spray and throat lozenges - OTC guaifenesin  (Mucinex ) 600 mg twice daily for congestion - Saline sinus flushes or a neti pot - Humidifying the air - Claritin without the decongestant     ED Prescriptions     Medication Sig Dispense Auth. Provider   benzonatate  (TESSALON ) 100 MG capsule Take 1 capsule (100 mg total) by mouth 3 (three) times daily as needed for cough. Do not take with alcohol  or while operating or driving heavy machinery 21 capsule Chandra Harlene LABOR, NP      PDMP not reviewed this encounter.    Chandra Harlene LABOR, NP 12/23/23 1634     [1]  Social History Tobacco Use   Smoking status: Never   Smokeless tobacco: Never  Vaping Use   Vaping status: Never Used  Substance Use Topics   Alcohol  use: Yes    Alcohol /week: 0.0 standard drinks of alcohol     Comment: 2 drinks per week(Sundays)   Drug  use: Never     Chandra Harlene LABOR, NP 12/23/23 [phone]

## 2023-12-28 DIAGNOSIS — Z96653 Presence of artificial knee joint, bilateral: Secondary | ICD-10-CM | POA: Diagnosis not present

## 2023-12-28 DIAGNOSIS — Z4789 Encounter for other orthopedic aftercare: Secondary | ICD-10-CM | POA: Diagnosis not present

## 2024-01-04 ENCOUNTER — Ambulatory Visit (HOSPITAL_COMMUNITY)
Admission: EM | Admit: 2024-01-04 | Discharge: 2024-01-04 | Disposition: A | Discharge status: Home / Self Care | Attending: Internal Medicine | Admitting: Internal Medicine

## 2024-01-04 ENCOUNTER — Encounter (HOSPITAL_COMMUNITY): Payer: Self-pay

## 2024-01-04 ENCOUNTER — Ambulatory Visit (INDEPENDENT_AMBULATORY_CARE_PROVIDER_SITE_OTHER)

## 2024-01-04 DIAGNOSIS — R051 Acute cough: Secondary | ICD-10-CM | POA: Diagnosis not present

## 2024-01-04 DIAGNOSIS — J209 Acute bronchitis, unspecified: Secondary | ICD-10-CM | POA: Diagnosis not present

## 2024-01-04 DIAGNOSIS — I498 Other specified cardiac arrhythmias: Secondary | ICD-10-CM

## 2024-01-04 MED ORDER — ALBUTEROL SULFATE HFA 108 (90 BASE) MCG/ACT IN AERS: 1.0000 | INHALATION_SPRAY | Freq: Four times a day (QID) | RESPIRATORY_TRACT | 0 refills | Status: AC | PRN

## 2024-01-04 MED ORDER — DOXYCYCLINE HYCLATE 100 MG PO CAPS: 100.0000 mg | ORAL_CAPSULE | Freq: Two times a day (BID) | ORAL | 0 refills | Status: AC

## 2024-01-04 MED ORDER — PREDNISONE 20 MG PO TABS: 40.0000 mg | ORAL_TABLET | Freq: Every day | ORAL | 0 refills | Status: AC

## 2024-01-04 MED ORDER — BENZONATATE 100 MG PO CAPS: 100.0000 mg | ORAL_CAPSULE | Freq: Three times a day (TID) | ORAL | 0 refills | Status: DC

## 2024-01-04 NOTE — ED Provider Notes (Signed)
 " [facility]    CSN: [id number] Arrival date & time: 01/04/24  1503      History   Chief Complaint Chief Complaint  Patient presents with   Cough    HPI Allen Sanchez is a 79 y.o. male.   Allen Sanchez is a 78 y.o. male presenting for chief complaint of cough and congestion that started approximately 12 days ago. Cough has improved significantly over the last few days and is almost gone. He is here today because his wife tested positive for flu 3 day ago and he wonders if he might have the flu too, requesting testing. He was seen at urgent care at beginning of illness on 12/23/2023 where he tested negative for influenza.  He denies shortness of breath, chest pain, dizziness, fever/chills, nausea, vomiting, diarrhea, abdominal pain, leg swelling, and orthopnea. He is a type 2 diabetic, most recent hemoglobin A1C was around 5.5. Denies recent antibiotic/steroid use.  Denies history of asthma/copd.  He has been taking tessalon  perles at home with relief of cough and is requesting a refill of this medication today.     Apical heart rate     Cough   Past Medical History:  Diagnosis Date   Aortic stenosis    severe in 03/2023   Arthritis    knee   Diabetes mellitus without complication (HCC)    Dysrhythmia    Eczema    ED (erectile dysfunction)    GERD (gastroesophageal reflux disease)    Horseshoe kidney    Hyperlipidemia    Neuromuscular disorder (HCC)    Sciatic nerve pain greater left -under physical therapy now   PAC (premature atrial contraction)     Patient Active Problem List   Diagnosis Date Noted   Primary osteoarthritis of left knee 08/23/2023   Primary osteoarthritis of right knee 05/10/2023   Preoperative clearance 03/12/2023   Elevated coronary artery calcium  score 10/21/2021   Moderate aortic stenosis 04/16/2017   Murmur, cardiac 11/13/2013   Overweight 11/13/2013   PAC (premature atrial contraction) 11/13/2013   Hyperlipidemia  11/13/2013   Erectile dysfunction 02/23/2011    Past Surgical History:  Procedure Laterality Date   COLONOSCOPY WITH PROPOFOL  N/A 05/29/2014   Procedure: COLONOSCOPY WITH PROPOFOL ;  Surgeon: Gladis MARLA Louder, MD;  Location: [facility];  Service: Endoscopy;  Laterality: N/A;   TOTAL KNEE ARTHROPLASTY Right 05/10/2023   Procedure: ARTHROPLASTY, KNEE, TOTAL;  Surgeon: Edna Toribio LABOR, MD;  Location: [facility];  Service: Orthopedics;  Laterality: Right;   TOTAL KNEE ARTHROPLASTY Left 08/23/2023   Procedure: ARTHROPLASTY, KNEE, TOTAL;  Surgeon: Edna Toribio LABOR, MD;  Location: [facility];  Service: Orthopedics;  Laterality: Left;       Home Medications    Prior to Admission medications  Medication Sig Start Date End Date Taking? Authorizing Provider  albuterol (VENTOLIN HFA) 108 (90 Base) MCG/ACT inhaler Inhale 1-2 puffs into the lungs every 6 (six) hours as needed for wheezing or shortness of breath. 01/04/24  Yes Enedelia Dorna HERO, FNP  benzonatate  (TESSALON ) 100 MG capsule Take 1 capsule (100 mg total) by mouth every 8 (eight) hours. 01/04/24  Yes Enedelia Dorna HERO, FNP  doxycycline (VIBRAMYCIN) 100 MG capsule Take 1 capsule (100 mg total) by mouth 2 (two) times daily for 7 days. 01/04/24 01/11/24 Yes Enedelia Dorna HERO, FNP  predniSONE (DELTASONE) 20 MG tablet Take 2 tablets (40 mg total) by mouth daily with breakfast for 5 days. 01/04/24 01/09/24 Yes Enedelia Dorna HERO, FNP  bisacodyl (DULCOLAX) 5 MG EC tablet Take 5-10 mg by mouth daily as needed (constipation.). Patient not taking: Reported on 12/23/2023    [provider]  brimonidine  (ALPHAGAN ) 0.2 % ophthalmic solution Place 1 drop into both eyes in the morning and at bedtime. 10/17/21   [provider]  calcium  carbonate (TUMS - DOSED IN MG ELEMENTAL CALCIUM ) 500 MG chewable tablet Chew 1-2 tablets by mouth 3 (three) times daily as needed for indigestion or heartburn.    [provider]  CIALIS  20 MG tablet Take 20 mg by mouth daily as needed for erectile dysfunction. 08/25/13   [provider]  dorzolamide -timolol  (COSOPT ) 2-0.5 % ophthalmic solution Place 1 drop into both eyes 2 (two) times daily. 02/11/23   [provider]  hydrocortisone  2.5 % cream Apply 1 Application topically daily as needed (eczema). 03/03/22   [provider]  latanoprost  (XALATAN ) 0.005 % ophthalmic solution Place 1 drop into both eyes at bedtime. 09/02/21   [provider]  Multiple Vitamin (MULTIVITAMIN WITH MINERALS) TABS tablet Take 1 tablet by mouth in the morning. Centrum Silver    [provider]  Omega-3 Fatty Acids (FISH OIL PO) Take 1,000 mg by mouth in the morning.    [provider]  omeprazole  (PRILOSEC) 40 MG capsule Take 1 capsule (40 mg total) by mouth daily for 21 days. 08/23/23 09/13/23  Renae Bernarda HERO, PA-C  primidone  (MYSOLINE ) 50 MG tablet Take 50 mg by mouth daily at 6 PM.    [provider]  rosuvastatin  (CRESTOR ) 40 MG tablet TAKE 1 TABLET(40 MG) BY MOUTH DAILY 11/11/23   Court Dorn PARAS, MD  triamcinolone  cream (KENALOG) 0.1 % Apply 1 Application topically 2 (two) times daily as needed (eczema). 03/18/23   [provider]    Family History Family History  Problem Relation Age of Onset   Aneurysm Mother    Stroke Father     Social History Social History[1]   Allergies   Crestor  [rosuvastatin ], Lipitor [atorvastatin], Pravastatin, and Zetia [ezetimibe]   Review of Systems Review of Systems  Respiratory:  Positive for cough.   Per HPI   Physical Exam Triage Vital Signs ED Triage Vitals  Encounter Vitals Group     BP 01/04/24 1529 (!) 150/92     Girls Systolic BP Percentile --      Girls Diastolic BP Percentile --      Boys Systolic BP Percentile --      Boys Diastolic BP Percentile --      Pulse Rate 01/04/24 1529 (!) 31     Resp 01/04/24 1529 16     Temp 01/04/24 1529 99.1 F (37.3 C)     Temp  Source 01/04/24 1529 Oral     SpO2 01/04/24 1529 97 %     Weight --      Height --      Head Circumference --      Peak Flow --      Pain Score 01/04/24 1525 0     Pain Loc --      Pain Education --      Exclude from Growth Chart --    No data found.  Updated Vital Signs BP (!) 150/92 (BP Location: Right Arm)   Pulse (!) 31   Temp 99.1 F (37.3 C) (Oral)   Resp 16   SpO2 97%   Visual Acuity Right Eye Distance:   Left Eye Distance:   Bilateral Distance:  Right Eye Near:   Left Eye Near:    Bilateral Near:     Physical Exam Vitals and nursing note reviewed.  Constitutional:      Appearance: He is not ill-appearing or toxic-appearing.  HENT:     Head: Normocephalic and atraumatic.     Right Ear: Hearing and external ear normal.     Left Ear: Hearing and external ear normal.     Nose: Nose normal.     Mouth/Throat:     Lips: Pink.     Mouth: Mucous membranes are moist.  Eyes:     General: Lids are normal. Vision grossly intact. Gaze aligned appropriately.     Extraocular Movements: Extraocular movements intact.     Conjunctiva/sclera: Conjunctivae normal.  Cardiovascular:     Rate and Rhythm: Bradycardia present. Rhythm irregular.     Heart sounds: Normal heart sounds, S1 normal and S2 normal.     Comments: Apical heart rate to auscultation is 56 bpm.  Murmur heard (baseline). Pulmonary:     Effort: Pulmonary effort is normal. No respiratory distress.     Breath sounds: Normal air entry. Wheezing present. No rhonchi or rales.     Comments: Expiratory wheezing to the diffuse bilateral upper and lower lungs. Speaking in full sentences without difficulty or cough.  Chest:     Chest wall: No tenderness.  Musculoskeletal:     Cervical back: Neck supple.  Skin:    General: Skin is warm and dry.     Capillary Refill: Capillary refill takes less than 2 seconds.     Findings: No rash.  Neurological:     General: No focal deficit present.     Mental Status: He is  alert and oriented to person, place, and time. Mental status is at baseline.     Cranial Nerves: No dysarthria or facial asymmetry.  Psychiatric:        Mood and Affect: Mood normal.        Speech: Speech normal.        Behavior: Behavior normal.        Thought Content: Thought content normal.        Judgment: Judgment normal.      UC Treatments / Results  Labs (all labs ordered are listed, but only abnormal results are displayed) Labs Reviewed - No data to display  EKG   Radiology DG Chest 2 View Result Date: 01/04/2024 EXAM: 2 VIEW(S) XRAY OF THE CHEST 01/04/2024 04:13:25 PM COMPARISON: 08/28/2022 CLINICAL HISTORY: cough and congestion for the last 2 weeks FINDINGS: LUNGS AND PLEURA: No focal pulmonary opacity. No pleural effusion. No pneumothorax. HEART AND MEDIASTINUM: Mild cardiomegaly. Aortic atherosclerosis. BONES AND SOFT TISSUES: Moderate thoracic spondylosis. No acute osseous abnormality. IMPRESSION: 1. No acute findings. 2. Mild cardiomegaly and aortic atherosclerosis. Electronically signed by: Greig Pique MD 01/04/2024 05:15 PM EST RP Workstation: [id number]    Procedures Procedures (including critical care time)  Medications Ordered in UC Medications - No data to display  Initial Impression / Assessment and Plan / UC Course  I have reviewed the triage vital signs and the nursing notes.  Pertinent labs & imaging results that were available during my care of the patient were reviewed by me and considered in my medical decision making (see chart for details).   1. Acute cough, acute bronchitis, ventricular bigeminy Chest x-ray shows no acute cardiopulmonary abnormality.  We are not testing for flu today as he is outside of the window for treatment with Tamiflu  and I have low suspicion for acute influenza infection. He is agreeable with this. Prednisone burst, albuterol inhaler, and doxycycline to cover for atypical infection has been ordered. Refill of Tessalon   Perles has been ordered.  In triage, patient's heart rate was documented at 31 bpm on pulse ox monitor.  Apical heart rate on my auscultation during exam is 56 bpm. EKG shows ventricular bigeminy with a ventricular rate of 64 bpm without ST/T wave changes. Patient has had PACs/ventricular bigeminy in the past on EKGs per chart review. Reviewed case with Dr. Vonna who agrees that there is no clinical indication for change in treatment plan based on patient's EKG findings today. He appears hydrated on exam, though he admits to drinking only 1 bottle of water  per day.  I have advised him to increase his water  intake and follow-up with cardiology as scheduled.  He is to go to the ER if he develops chest pain, shortness of breath, heart palpitations, leg swelling, orthopnea, etc.  Counseled patient on potential for adverse effects with medications prescribed/recommended today, strict ER and return-to-clinic precautions discussed, patient verbalized understanding.   Final Clinical Impressions(s) / UC Diagnoses   Final diagnoses:  Acute cough  Acute bronchitis, unspecified organism  Ventricular bigeminy     Discharge Instructions      You have bronchitis which is inflammation of the upper airways in your lungs due to a virus.   Take doxycycline antibiotic every 12 hours for 7 days.  Start taking prednisone pills as prescribed tomorrow with food once daily as prescribed (2 pills - 40mg  - once daily preferably in the morning).   Do not take any NSAIDs with steroid pills (no ibuprofen, naproxen while taking steroid, this could cause stomach upset).   Use albuterol inhaler 2 puffs every 4-6 hours on a schedule for the next 24 hours while the steroid kicks in, then as needed for cough, shortness of breath, and wheezing.   Use guaifenesin  (plain mucinex ) to break up congestion in nose/chest so that you are able to excrete easier. Drink plenty of fluids to stay well hydrated while taking mucinex   so that it works well in the body.   If you develop any new or worsening symptoms or if your symptoms do not start to improve, please return here or follow-up with your primary care provider. If your symptoms are severe, please go to the emergency room.      ED Prescriptions     Medication Sig Dispense Auth. Provider   predniSONE (DELTASONE) 20 MG tablet Take 2 tablets (40 mg total) by mouth daily with breakfast for 5 days. 10 tablet Enedelia Dorna HERO, FNP   benzonatate  (TESSALON ) 100 MG capsule Take 1 capsule (100 mg total) by mouth every 8 (eight) hours. 21 capsule Enedelia Dorna M, FNP   doxycycline (VIBRAMYCIN) 100 MG capsule Take 1 capsule (100 mg total) by mouth 2 (two) times daily for 7 days. 14 capsule Enedelia Dorna HERO, FNP   albuterol (VENTOLIN HFA) 108 (90 Base) MCG/ACT inhaler Inhale 1-2 puffs into the lungs every 6 (six) hours as needed for wheezing or shortness of breath. 18 g Enedelia Dorna HERO, FNP      PDMP not reviewed this encounter.     [1]  Social History Tobacco Use   Smoking status: Never   Smokeless tobacco: Never  Vaping Use   Vaping status: Never Used  Substance Use Topics   Alcohol  use: Yes    Alcohol /week: 0.0 standard drinks of alcohol   Comment: 2 drinks per week(Sundays)   Drug use: Never     Enedelia Dorna HERO, FNP 01/04/24 1729  "

## 2024-01-04 NOTE — Discharge Instructions (Addendum)
 You have bronchitis which is inflammation of the upper airways in your lungs due to a virus.   Take doxycycline antibiotic every 12 hours for 7 days.  Start taking prednisone pills as prescribed tomorrow with food once daily as prescribed (2 pills - 40mg  - once daily preferably in the morning).   Do not take any NSAIDs with steroid pills (no ibuprofen, naproxen while taking steroid, this could cause stomach upset).   Use albuterol inhaler 2 puffs every 4-6 hours on a schedule for the next 24 hours while the steroid kicks in, then as needed for cough, shortness of breath, and wheezing.   Use guaifenesin  (plain mucinex ) to break up congestion in nose/chest so that you are able to excrete easier. Drink plenty of fluids to stay well hydrated while taking mucinex  so that it works well in the body.   If you develop any new or worsening symptoms or if your symptoms do not start to improve, please return here or follow-up with your primary care provider. If your symptoms are severe, please go to the emergency room.

## 2024-01-04 NOTE — ED Triage Notes (Signed)
 Patient here today with c/o cough X 2 weeks. Patient would like a refill on the benzonatate  that he got when he was here on 12/23/2023. Patient states that his wife tested positive for Flu on Saturday and he is requesting a flu test. Denies other symptoms. Patient states that his cough is improving.

## 2024-01-05 ENCOUNTER — Ambulatory Visit (HOSPITAL_COMMUNITY): Payer: Self-pay

## 2024-01-12 ENCOUNTER — Telehealth: Payer: Self-pay | Admitting: Cardiovascular Disease

## 2024-01-12 NOTE — Telephone Encounter (Signed)
 Pt c/o BP issue: STAT if pt c/o blurred vision, one-sided weakness or slurred speech.  STAT if BP is GREATER than 180/120 TODAY.  STAT if BP is LESS than 90/60 and SYMPTOMATIC TODAY  1. What is your BP concern? Hypertension   2. Have you taken any BP medication today?no   3. What are your last 5 BP readings? Unknown   4. Are you having any other symptoms (ex. Dizziness, headache, blurred vision, passed out)? No   Please advise

## 2024-01-12 NOTE — Telephone Encounter (Signed)
 Patient identification verified by 2 forms.   Called and spoke to patient  Patient states:  -Pt has had blood pressure checked at urgent care recently.  -Provider was concerned. Advised pt to reach out to cardiology.  -Readings were 150, 153  Patient denies:  -Symptoms  Interventions/Plan: -Appt made for next week with Dr. Court.  -Advised pt to take blood pressure daily until he sees Dr. Court -Message sent to pt with blood pressure tips.   Reviewed ED warning signs/precautions  Patient agrees with plan, no questions at this time

## 2024-01-13 NOTE — Telephone Encounter (Signed)
 Court Dorn PARAS, MD to Melida Rolin HERO, RN     01/12/24  4:03 PM Mildly elevated blood pressure.  Have him keep a blood pressure log for 30 days and come in to see a Pharm.D. to review.  Pt states that he was recently sick with bronchitis and was on medication for that for about 2 weeks. Pt states that he completed medications for virus and will now be able to get back to his normal routine of eating and drink as before he was sick. Pt will keep a blood pressure log until his he is seen next week with Dr. Court. Pt verbalizes understanding.

## 2024-01-19 ENCOUNTER — Encounter: Payer: Self-pay | Admitting: Cardiovascular Disease

## 2024-01-19 ENCOUNTER — Ambulatory Visit: Admitting: Cardiovascular Disease

## 2024-01-19 VITALS — BP 120/90 | HR 89 | Ht 63.0 in | Wt 192.8 lb

## 2024-01-19 DIAGNOSIS — I491 Atrial premature depolarization: Secondary | ICD-10-CM | POA: Diagnosis not present

## 2024-01-19 DIAGNOSIS — E782 Mixed hyperlipidemia: Secondary | ICD-10-CM

## 2024-01-19 DIAGNOSIS — I350 Nonrheumatic aortic (valve) stenosis: Secondary | ICD-10-CM

## 2024-01-19 DIAGNOSIS — R931 Abnormal findings on diagnostic imaging of heart and coronary circulation: Secondary | ICD-10-CM | POA: Diagnosis not present

## 2024-01-19 MED ORDER — EZETIMIBE 10 MG PO TABS: 10.0000 mg | ORAL_TABLET | Freq: Every day | ORAL | 3 refills | Status: AC

## 2024-01-19 NOTE — Progress Notes (Signed)
 "     01/19/2024 Allen Sanchez   09-26-1945  [id number]  Primary Physician Regino Slater, MD Primary Cardiologist: Dorn [name], [name], [name]  HPI:  Allen Sanchez is a 79 y.o.   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  I last saw him in the office 03/12/2023.  His only cardiac risk factor is treated hyperlipidemia. He does have GERD. He's never had a heart attack or stroke. He denies chest pain or shortness of breath. A recent 2-D echo performed 03/11/17 revealed normal systolic function with moderate aortic stenosis, aortic valve area 1.03 cm with a peak gradient of 51 mmHg.   He had a 2D echo performed 03/10/2023 that demonstrated progression of his aortic stenosis now to severe with a valve area of 0.85 cm and a peak gradient of 60 mmHg which had increased from 46 mmHg when last checked.  He did have bilateral total knee replacements in May and August 2025 and both were uneventful.  He is recuperated from both of these.  Since I saw him in March of last year he has remained stable.  He specifically denies chest pain or shortness of breath.     Active Medications[1]   Allergies[2]  Social History   Socioeconomic History   Marital status: Married    Spouse name: Not on file   Number of children: Not on file   Years of education: Not on file   Highest education level: Not on file  Occupational History   Not on file  Tobacco Use   Smoking status: Never   Smokeless tobacco: Never  Vaping Use   Vaping status: Never Used  Substance and Sexual Activity   Alcohol  use: Yes    Alcohol /week: 0.0 standard drinks of alcohol     Comment: 2 drinks per week(Sundays)   Drug use: Never   Sexual activity: Not on file  Other Topics Concern    Not on file  Social History Narrative   Not on file   Social Drivers of Health   Tobacco Use: Low Risk (01/19/2024)   Patient History    Smoking Tobacco Use: Never    Smokeless Tobacco Use: Never    Passive Exposure: Not on file  Financial Resource Strain: Not on file  Food Insecurity: No Food Insecurity (08/23/2023)   Epic    Worried About Programme Researcher, Broadcasting/film/video in the Last Year: Never true    Ran Out of Food in the Last Year: Never true  Transportation Needs: No Transportation Needs (08/23/2023)   Epic    Lack of Transportation (Medical): No    Lack of Transportation (Non-Medical): No  Physical Activity: Not on file  Stress: Not on file  Social Connections: Socially Integrated (08/23/2023)   Social Connection and Isolation Panel    Frequency of Communication with Friends and Family: Three times a week    Frequency of Social Gatherings with Friends and Family: More than three times a week    Attends Religious Services: More than 4 times per year    Active Member of Clubs or Organizations: No    Attends Engineer, Structural: More than 4 times per year    Marital Status: Married  Catering Manager Violence: Not At Risk (08/23/2023)   Epic  Fear of Current or Ex-Partner: No    Emotionally Abused: No    Physically Abused: No    Sexually Abused: No  Depression (PHQ2-9): Not on file  Alcohol  Screen: Not on file  Housing: Low Risk (08/23/2023)   Epic    Unable to Pay for Housing in the Last Year: No    Number of Times Moved in the Last Year: 0    Homeless in the Last Year: No  Utilities: Not At Risk (08/23/2023)   Epic    Threatened with loss of utilities: No  Health Literacy: Not on file     Review of Systems: General: negative for chills, fever, night sweats or weight changes.  Cardiovascular: negative for chest pain, dyspnea on exertion, edema, orthopnea, palpitations, paroxysmal nocturnal dyspnea or shortness of breath Dermatological: negative for rash Respiratory:  negative for cough or wheezing Urologic: negative for hematuria Abdominal: negative for nausea, vomiting, diarrhea, bright red blood per rectum, melena, or hematemesis Neurologic: negative for visual changes, syncope, or dizziness All other systems reviewed and are otherwise negative except as noted above.    Blood pressure (!) 120/90, pulse 89, height 5' 3 (1.6 m), weight 192 lb 12.8 oz (87.5 kg), SpO2 97%.  General appearance: alert and no distress Neck: no adenopathy, no JVD, supple, symmetrical, trachea midline, thyroid not enlarged, symmetric, no tenderness/mass/nodules, and soft bilateral carotid bruit bruits versus transmitted murmur Lungs: clear to auscultation bilaterally Heart: 2/6 outflow tract murmur consistent with aortic stenosis Extremities: extremities normal, atraumatic, no cyanosis or edema Pulses: 2+ and symmetric Skin: Skin color, texture, turgor normal. No rashes or lesions Neurologic: Grossly normal  EKG not performed today      ASSESSMENT AND PLAN:   PAC (premature atrial contraction) Seen on 12 electrocardiogram today.  Patient is unaware of this.  Hyperlipidemia History of hyperlipidemia on Crestor  40 with lipid profile performed 08/13/2023 revealing total cholesterol 164, LDL 78 and HDL 68.  Moderate aortic stenosis Moderate aortic stenosis with 2D echo performed 03/10/2023 revealing normal LV systolic function, mild LVH with a valve area of 0.85 cm and a peak gradient of 60 mmHg.  He is completely asymptomatic.  Will continue to follow this on an annual basis.  Elevated coronary artery calcium  score Coronary calcium  score of 163 performed 10/29/2020 distributed in all 3 coronary arteries.  He is totally asymptomatic.  Based on his we are optimizing his risk factors.  His most recent LDL was 78 on atorvastatin 40 mg a day.     [name] MD FACP,FACC,FAHA, FSCAI 01/19/2024 4:08 PM    [1]  Current Meds  Medication Sig   albuterol  (VENTOLIN   HFA) 108 (90 Base) MCG/ACT inhaler Inhale 1-2 puffs into the lungs every 6 (six) hours as needed for wheezing or shortness of breath.   brimonidine  (ALPHAGAN ) 0.2 % ophthalmic solution Place 1 drop into both eyes in the morning and at bedtime.   calcium  carbonate (TUMS - DOSED IN MG ELEMENTAL CALCIUM ) 500 MG chewable tablet Chew 1-2 tablets by mouth 3 (three) times daily as needed for indigestion or heartburn.   CIALIS 20 MG tablet Take 20 mg by mouth daily as needed for erectile dysfunction.   dorzolamide -timolol  (COSOPT ) 2-0.5 % ophthalmic solution Place 1 drop into both eyes 2 (two) times daily.   hydrocortisone  2.5 % cream Apply 1 Application topically daily as needed (eczema).   latanoprost  (XALATAN ) 0.005 % ophthalmic solution Place 1 drop into both eyes at bedtime.   Multiple Vitamin (MULTIVITAMIN WITH  MINERALS) TABS tablet Take 1 tablet by mouth in the morning. Centrum Silver   Omega-3 Fatty Acids (FISH OIL PO) Take 1,000 mg by mouth in the morning.   primidone  (MYSOLINE ) 50 MG tablet Take 50 mg by mouth daily at 6 PM.   rosuvastatin  (CRESTOR ) 40 MG tablet TAKE 1 TABLET(40 MG) BY MOUTH DAILY   triamcinolone  cream (KENALOG) 0.1 % Apply 1 Application topically 2 (two) times daily as needed (eczema).  [2]  Allergies Allergen Reactions   Crestor  [Rosuvastatin ] Other (See Comments)    muscle weakness    Lipitor [Atorvastatin] Other (See Comments)    muscle weakness    Pravastatin Other (See Comments)    myalgias   Zetia  [Ezetimibe ] Other (See Comments)    muscle weakness    "

## 2024-01-19 NOTE — Assessment & Plan Note (Signed)
 Coronary calcium  score of 163 performed 10/29/2020 distributed in all 3 coronary arteries.  He is totally asymptomatic.  Based on his we are optimizing his risk factors.  His most recent LDL was 78 on atorvastatin 40 mg a day.

## 2024-01-19 NOTE — Assessment & Plan Note (Signed)
 Seen on 12 electrocardiogram today.  Patient is unaware of this.

## 2024-01-19 NOTE — Assessment & Plan Note (Signed)
 Moderate aortic stenosis with 2D echo performed 03/10/2023 revealing normal LV systolic function, mild LVH with a valve area of 0.85 cm and a peak gradient of 60 mmHg.  He is completely asymptomatic.  Will continue to follow this on an annual basis.

## 2024-01-19 NOTE — Patient Instructions (Signed)
 Medication Instructions:  Your physician has recommended you make the following change in your medication:   -Start ezetimibe  (Zetia ) 10mg  once daily.  *If you need a refill on your cardiac medications before your next appointment, please call your pharmacy*   Lab Work: Your physician recommends that you return for lab work in: 3 months for FASTING Lipid/liver panel  If you have labs (blood work) drawn today and your tests are completely normal, you will receive your results only by: MyChart Message (if you have MyChart) OR A paper copy in the mail If you have any lab test that is abnormal or we need to change your treatment, we will call you to review the results.   Testing/Procedures: Your physician has requested that you have an echocardiogram. Echocardiography is a painless test that uses sound waves to create images of your heart. It provides your doctor with information about the size and shape of your heart and how well your hearts chambers and valves are working. This procedure takes approximately one hour. There are no restrictions for this procedure. Please do NOT wear cologne, perfume, aftershave, or lotions (deodorant is allowed). Please arrive 15 minutes prior to your appointment time.  Please note: We ask at that you not bring children with you during ultrasound (echo/ vascular) testing. Due to room size and safety concerns, children are not allowed in the ultrasound rooms during exams. Our front office staff cannot provide observation of children in our lobby area while testing is being conducted. An adult accompanying a patient to their appointment will only be allowed in the ultrasound room at the discretion of the ultrasound technician under special circumstances. We apologize for any inconvenience. **To do in March**   Follow-Up: At [facility] you and your health needs are our priority.  As part of our continuing mission to provide you with exceptional heart  care, our providers are all part of one team.  This team includes your primary Cardiologist (physician) and Advanced Practice Providers or APPs (Physician Assistants and Nurse Practitioners) who all work together to provide you with the care you need, when you need it.  Your next appointment:   12 month(s)  Provider:   Dorn Lesches, MD    We recommend signing up for the patient portal called MyChart.  Sign up information is provided on this After Visit Summary.  MyChart is used to connect with patients for Virtual Visits (Telemedicine).  Patients are able to view lab/test results, encounter notes, upcoming appointments, etc.  Non-urgent messages can be sent to your provider as well.   To learn more about what you can do with MyChart, go to [email or web address].   Other Instructions

## 2024-01-19 NOTE — Assessment & Plan Note (Addendum)
 He does start 1 more time history of hyperlipidemia on Crestor  40 with lipid profile performed 08/13/2023 revealing total cholesterol 164, LDL 78 and HDL 68.  Given his elevated coronary calcium  score he is not at goal for secondary prevention (LDL less than 70).  I am going to try him on Zetia  10 mg a day and we will recheck a lipid liver profile in 3 months.  If he does not tolerate this we will explore PCSK9.

## 2024-03-06 ENCOUNTER — Ambulatory Visit: Admitting: Cardiovascular Disease

## 2024-03-10 ENCOUNTER — Ambulatory Visit (HOSPITAL_COMMUNITY)
